# Patient Record
Sex: Female | Born: 1980 | Race: White | Hispanic: No | Marital: Married | Smoking: Never smoker
Health system: Southern US, Community
[De-identification: ages and names within clinical notes are randomized; demographics above are authoritative.]

## PROBLEM LIST (undated history)

## (undated) DIAGNOSIS — Z302 Encounter for sterilization: Secondary | ICD-10-CM

## (undated) DIAGNOSIS — F53 Postpartum depression: Secondary | ICD-10-CM

## (undated) DIAGNOSIS — Z3493 Encounter for supervision of normal pregnancy, unspecified, third trimester: Secondary | ICD-10-CM

## (undated) DIAGNOSIS — J45909 Unspecified asthma, uncomplicated: Secondary | ICD-10-CM

## (undated) DIAGNOSIS — O99345 Other mental disorders complicating the puerperium: Secondary | ICD-10-CM

## (undated) DIAGNOSIS — F419 Anxiety disorder, unspecified: Secondary | ICD-10-CM

## (undated) DIAGNOSIS — F329 Major depressive disorder, single episode, unspecified: Secondary | ICD-10-CM

## (undated) DIAGNOSIS — F32A Depression, unspecified: Secondary | ICD-10-CM

## (undated) HISTORY — DX: Postpartum depression: F53.0

## (undated) HISTORY — DX: Encounter for supervision of normal pregnancy, unspecified, third trimester: Z34.93

## (undated) HISTORY — DX: Depression, unspecified: F32.A

## (undated) HISTORY — DX: Other mental disorders complicating the puerperium: O99.345

## (undated) HISTORY — PX: TONSILLECTOMY: SUR1361

## (undated) HISTORY — DX: Anxiety disorder, unspecified: F41.9

## (undated) HISTORY — DX: Major depressive disorder, single episode, unspecified: F32.9

---
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
## 2013-05-01 ENCOUNTER — Ambulatory Visit (INDEPENDENT_AMBULATORY_CARE_PROVIDER_SITE_OTHER): Payer: 59

## 2013-05-01 ENCOUNTER — Encounter: Payer: Self-pay | Admitting: Obstetrics and Gynecology

## 2013-05-01 DIAGNOSIS — N926 Irregular menstruation, unspecified: Secondary | ICD-10-CM

## 2013-05-01 DIAGNOSIS — Z3201 Encounter for pregnancy test, result positive: Secondary | ICD-10-CM

## 2013-05-01 LAB — POCT PREGNANCY, URINE: Preg Test, Ur: POSITIVE — AB

## 2013-05-01 NOTE — Progress Notes (Signed)
Pt. Came in today for a pregnancy test as she has not had a period since March 20, 2013. Pt. States her periods are regular. Pt. Wants to be seen here. Will obtain OB panel and urine culture today and pt. Will schedule a NOB appointment when she checks out.

## 2013-05-02 LAB — OBSTETRIC PANEL
Basophils Absolute: 0 10*3/uL (ref 0.0–0.1)
Basophils Relative: 0 % (ref 0–1)
Eosinophils Absolute: 0.7 10*3/uL (ref 0.0–0.7)
Hemoglobin: 12.7 g/dL (ref 12.0–15.0)
Hepatitis B Surface Ag: NEGATIVE
MCH: 27.7 pg (ref 26.0–34.0)
MCHC: 34.2 g/dL (ref 30.0–36.0)
MCV: 80.8 fL (ref 78.0–100.0)
Monocytes Absolute: 1 10*3/uL (ref 0.1–1.0)
Monocytes Relative: 7 % (ref 3–12)
Neutro Abs: 10.3 10*3/uL — ABNORMAL HIGH (ref 1.7–7.7)
Neutrophils Relative %: 67 % (ref 43–77)
Platelets: 331 10*3/uL (ref 150–400)
RBC: 4.59 MIL/uL (ref 3.87–5.11)
RDW: 14.5 % (ref 11.5–15.5)
Rubella: 3.51 Index — ABNORMAL HIGH (ref <0.90)

## 2013-05-03 LAB — CULTURE, OB URINE: Colony Count: 3000

## 2013-05-06 ENCOUNTER — Ambulatory Visit: Payer: Self-pay

## 2013-05-20 ENCOUNTER — Encounter: Payer: 59 | Admitting: Advanced Practice Midwife

## 2013-05-23 NOTE — L&D Delivery Note (Signed)
Delivery Note At 1:24 PM a viable female was delivered via Vaginal, Spontaneous Delivery (Presentation: Left Occiput Anterior).  APGAR: 8, 9; weight pending.   Placenta status: , Spontaneous Manual removal.  Cord: 3 vessels with the following complications: None.  Anesthesia: Epidural  Episiotomy: None Lacerations: Labial-bilateral Suture Repair: 3.0 vicryl rapide on right side only Est. Blood Loss (mL): 600  Mom to postpartum.  Baby to Couplet care / Skin to Skin.  Will do circumcision tomorrow am.  Ziona Wickens D 12/27/2013, 1:51 PM

## 2013-05-31 LAB — OB RESULTS CONSOLE RPR: RPR: NONREACTIVE

## 2013-05-31 LAB — OB RESULTS CONSOLE HIV ANTIBODY (ROUTINE TESTING): HIV: NONREACTIVE

## 2013-05-31 LAB — OB RESULTS CONSOLE GC/CHLAMYDIA
Chlamydia: NEGATIVE
Gonorrhea: NEGATIVE

## 2013-10-21 ENCOUNTER — Inpatient Hospital Stay (HOSPITAL_COMMUNITY): Admission: AD | Admit: 2013-10-21 | Payer: 59 | Source: Ambulatory Visit | Admitting: Obstetrics and Gynecology

## 2013-11-29 LAB — OB RESULTS CONSOLE GBS: GBS: POSITIVE

## 2013-12-25 ENCOUNTER — Encounter: Payer: Self-pay | Admitting: Obstetrics and Gynecology

## 2013-12-25 ENCOUNTER — Inpatient Hospital Stay (HOSPITAL_COMMUNITY)
Admission: AD | Admit: 2013-12-25 | Discharge: 2013-12-29 | DRG: 774 | Disposition: A | Discharge status: Home / Self Care | Payer: 59 | Source: Ambulatory Visit | Attending: Obstetrics and Gynecology | Admitting: Obstetrics and Gynecology

## 2013-12-25 ENCOUNTER — Other Ambulatory Visit: Payer: Self-pay | Admitting: Obstetrics and Gynecology

## 2013-12-25 ENCOUNTER — Encounter (HOSPITAL_COMMUNITY): Payer: Self-pay | Admitting: *Deleted

## 2013-12-25 DIAGNOSIS — O9989 Other specified diseases and conditions complicating pregnancy, childbirth and the puerperium: Secondary | ICD-10-CM

## 2013-12-25 DIAGNOSIS — Z3490 Encounter for supervision of normal pregnancy, unspecified, unspecified trimester: Secondary | ICD-10-CM

## 2013-12-25 DIAGNOSIS — O99892 Other specified diseases and conditions complicating childbirth: Secondary | ICD-10-CM | POA: Diagnosis present

## 2013-12-25 DIAGNOSIS — Z22330 Carrier of Group B streptococcus: Secondary | ICD-10-CM

## 2013-12-25 DIAGNOSIS — O09519 Supervision of elderly primigravida, unspecified trimester: Secondary | ICD-10-CM | POA: Diagnosis present

## 2013-12-25 DIAGNOSIS — J45909 Unspecified asthma, uncomplicated: Secondary | ICD-10-CM | POA: Diagnosis present

## 2013-12-25 DIAGNOSIS — Z3493 Encounter for supervision of normal pregnancy, unspecified, third trimester: Secondary | ICD-10-CM

## 2013-12-25 HISTORY — DX: Unspecified asthma, uncomplicated: J45.909

## 2013-12-25 HISTORY — DX: Encounter for supervision of normal pregnancy, unspecified, third trimester: Z34.93

## 2013-12-25 LAB — CBC
HCT: 36.7 % (ref 36.0–46.0)
Hemoglobin: 12.1 g/dL (ref 12.0–15.0)
MCH: 26.7 pg (ref 26.0–34.0)
MCHC: 33 g/dL (ref 30.0–36.0)
MCV: 80.8 fL (ref 78.0–100.0)
PLATELETS: 269 10*3/uL (ref 150–400)
RBC: 4.54 MIL/uL (ref 3.87–5.11)
RDW: 14.7 % (ref 11.5–15.5)
WBC: 15.4 10*3/uL — AB (ref 4.0–10.5)

## 2013-12-25 MED ORDER — ACETAMINOPHEN 325 MG PO TABS: 650.0000 mg | ORAL_TABLET | ORAL | Status: DC | PRN

## 2013-12-25 MED ORDER — MISOPROSTOL 200 MCG PO TABS
50.0000 ug | ORAL_TABLET | ORAL | Status: DC | PRN
  Administered 2013-12-25 – 2013-12-26 (×3): 50 ug via ORAL
  Filled 2013-12-25: qty 1
  Filled 2013-12-25 (×2): qty 0.5

## 2013-12-25 MED ORDER — OXYTOCIN 40 UNITS IN LACTATED RINGERS INFUSION - SIMPLE MED
1.0000 m[IU]/min | INTRAVENOUS | Status: DC
  Administered 2013-12-26: 2 m[IU]/min via INTRAVENOUS
  Filled 2013-12-25: qty 1000

## 2013-12-25 MED ORDER — LACTATED RINGERS IV SOLN
INTRAVENOUS | Status: DC
  Administered 2013-12-25 – 2013-12-26 (×2): via INTRAVENOUS
  Administered 2013-12-26: 1000 mL via INTRAVENOUS
  Administered 2013-12-27: 07:00:00 via INTRAVENOUS

## 2013-12-25 MED ORDER — OXYTOCIN BOLUS FROM INFUSION: 500.0000 mL | INTRAVENOUS | Status: DC

## 2013-12-25 MED ORDER — BUTORPHANOL TARTRATE 1 MG/ML IJ SOLN
1.0000 mg | INTRAMUSCULAR | Status: DC | PRN
  Administered 2013-12-26 (×2): 1 mg via INTRAVENOUS
  Filled 2013-12-25 (×2): qty 1

## 2013-12-25 MED ORDER — TERBUTALINE SULFATE 1 MG/ML IJ SOLN: 0.2500 mg | Freq: Once | INTRAMUSCULAR | Status: AC | PRN

## 2013-12-25 MED ORDER — IBUPROFEN 600 MG PO TABS
600.0000 mg | ORAL_TABLET | Freq: Four times a day (QID) | ORAL | Status: DC | PRN
  Filled 2013-12-25: qty 1

## 2013-12-25 MED ORDER — LACTATED RINGERS IV SOLN
500.0000 mL | INTRAVENOUS | Status: DC | PRN
  Administered 2013-12-26: 1000 mL via INTRAVENOUS

## 2013-12-25 MED ORDER — CITRIC ACID-SODIUM CITRATE 334-500 MG/5ML PO SOLN: 30.0000 mL | ORAL | Status: DC | PRN

## 2013-12-25 MED ORDER — LIDOCAINE HCL (PF) 1 % IJ SOLN
30.0000 mL | INTRAMUSCULAR | Status: AC | PRN
  Administered 2013-12-27: 30 mL via SUBCUTANEOUS
  Filled 2013-12-25: qty 30

## 2013-12-25 MED ORDER — OXYCODONE-ACETAMINOPHEN 5-325 MG PO TABS: 1.0000 | ORAL_TABLET | ORAL | Status: DC | PRN

## 2013-12-25 MED ORDER — OXYTOCIN 40 UNITS IN LACTATED RINGERS INFUSION - SIMPLE MED
62.5000 mL/h | INTRAVENOUS | Status: DC
  Filled 2013-12-25: qty 1000

## 2013-12-25 MED ORDER — ONDANSETRON HCL 4 MG/2ML IJ SOLN
4.0000 mg | Freq: Four times a day (QID) | INTRAMUSCULAR | Status: DC | PRN
  Administered 2013-12-26 – 2013-12-27 (×3): 4 mg via INTRAVENOUS
  Filled 2013-12-25 (×3): qty 2

## 2013-12-25 NOTE — Progress Notes (Signed)
Patient ID: Olivia Rogers, female   DOB: 1980/10/09, 33 y.o.   MRN: [id number]  vtx by Bedside US.

## 2013-12-25 NOTE — H&P (Signed)
Serita Silver is a 33 y.o. female G1P0 at 46+ for IOL, d/w pt and FOB r/b/a of IOL.  Relatively uncomplicated PNC.  Low risk Panorama  Maternal Medical History:  Contractions: Frequency: irregular.   Perceived severity is mild.    Fetal activity: Perceived fetal activity is normal.    Prenatal Complications - Diabetes: none.    OB History   Grav Para Term Preterm Abortions TAB SAB Ect Mult Living   1             G1 present No abn pap, no STD  Past Medical History  Diagnosis Date  . Normal pregnancy in third trimester 12/25/2013  . Asthma   ADD  Past Surgical History  Procedure Laterality Date  . Tonsillectomy     Family History: family history is not on file. Social History:  reports that she has never smoked. She does not have any smokeless tobacco history on file. She reports that she does not drink alcohol or use illicit drugs.RN   Prenatal Transfer Tool  Maternal Diabetes: No Genetic Screening: Normal Maternal Ultrasounds/Referrals: Normal Fetal Ultrasounds or other Referrals:  None Maternal Substance Abuse:  No Significant Maternal Medications:  None Significant Maternal Lab Results:  Lab values include: Group B Strep positive Other Comments:  low risk Panorama  Review of Systems  Constitutional: Negative.   HENT: Negative.   Eyes: Negative.   Respiratory: Negative.   Cardiovascular: Negative.   Gastrointestinal: Negative.   Genitourinary: Negative.   Musculoskeletal: Negative.   Skin: Negative.   Neurological: Negative.   Psychiatric/Behavioral: Negative.     Dilation: Fingertip Effacement (%): Thick Station: -2 Exam by:: Dr. Ellyn Hack in office today Blood pressure 117/79, pulse 109, temperature 98.9 F (37.2 C), temperature source Oral, resp. rate 18, height 5\' 6"  (1.676 m), weight 107.049 kg (236 lb). Maternal Exam:  Abdomen: Fundal height is appropriate for gestation.   Estimated fetal weight is 8#.   Fetal presentation: vertex  Introitus:  Normal vulva. Normal vagina.  Pelvis: adequate for delivery.   Cervix: Cervix evaluated by digital exam.     Physical Exam  Constitutional: She is oriented to person, place, and time. She appears well-developed and well-nourished.  HENT:  Head: Normocephalic and atraumatic.  Cardiovascular: Normal rate and regular rhythm.   Respiratory: Effort normal and breath sounds normal. No respiratory distress. She has no wheezes.  GI: Soft. Bowel sounds are normal. She exhibits no distension. There is no tenderness.  Musculoskeletal: Normal range of motion.  Neurological: She is alert and oriented to person, place, and time.  Skin: Skin is warm and dry.  Psychiatric: She has a normal mood and affect. Her behavior is normal.    Prenatal labs: ABO, Rh: A/POS/-- (12/10 1201) Antibody: NEG (12/10 1201) Rubella: 3.51 (12/10 1201) RPR: Nonreactive (01/09 0000)  HBsAg: NEGATIVE (12/10 1201)  HIV: Non-reactive (01/09 0000)  GBS: Positive (07/10 0000)    Vtx by [place]   ToxoIgG neg, Panorama low risk, AFP WNL. CF neg/ CF neg/ AFP WNL/ Ur Cx +/ GC neg/ Chl neg/glucola 123  Assessment/Plan: 33yo G1P0 at 40+ for IOL Gbbs+ - start PCN in AM Expect SVD    Bovard-Stuckert, Miyako Oelke 12/25/2013, 8:33 PM

## 2013-12-26 ENCOUNTER — Encounter (HOSPITAL_COMMUNITY): Payer: Self-pay | Admitting: Anesthesiology

## 2013-12-26 ENCOUNTER — Inpatient Hospital Stay (HOSPITAL_COMMUNITY): Payer: 59 | Admitting: Anesthesiology

## 2013-12-26 ENCOUNTER — Encounter (HOSPITAL_COMMUNITY): Payer: 59 | Admitting: Anesthesiology

## 2013-12-26 LAB — TYPE AND SCREEN
ABO/RH(D): A POS
ANTIBODY SCREEN: NEGATIVE

## 2013-12-26 LAB — RPR

## 2013-12-26 MED ORDER — PHENYLEPHRINE 40 MCG/ML (10ML) SYRINGE FOR IV PUSH (FOR BLOOD PRESSURE SUPPORT)
80.0000 ug | PREFILLED_SYRINGE | INTRAVENOUS | Status: DC | PRN
  Filled 2013-12-26: qty 2
  Filled 2013-12-26: qty 10

## 2013-12-26 MED ORDER — DIPHENHYDRAMINE HCL 50 MG/ML IJ SOLN: 12.5000 mg | INTRAMUSCULAR | Status: DC | PRN

## 2013-12-26 MED ORDER — PENICILLIN G POTASSIUM 5000000 UNITS IJ SOLR
5.0000 10*6.[IU] | Freq: Once | INTRAVENOUS | Status: AC
  Administered 2013-12-26: 5 10*6.[IU] via INTRAVENOUS
  Filled 2013-12-26: qty 5

## 2013-12-26 MED ORDER — PHENYLEPHRINE 40 MCG/ML (10ML) SYRINGE FOR IV PUSH (FOR BLOOD PRESSURE SUPPORT)
PREFILLED_SYRINGE | INTRAVENOUS | Status: AC
  Filled 2013-12-26: qty 10

## 2013-12-26 MED ORDER — FENTANYL 2.5 MCG/ML BUPIVACAINE 1/10 % EPIDURAL INFUSION (WH - ANES)
INTRAMUSCULAR | Status: AC
  Filled 2013-12-26: qty 125

## 2013-12-26 MED ORDER — EPHEDRINE 5 MG/ML INJ
10.0000 mg | INTRAVENOUS | Status: DC | PRN
  Filled 2013-12-26: qty 2

## 2013-12-26 MED ORDER — PENICILLIN G POTASSIUM 5000000 UNITS IJ SOLR
2.5000 10*6.[IU] | INTRAVENOUS | Status: DC
  Administered 2013-12-26 – 2013-12-27 (×7): 2.5 10*6.[IU] via INTRAVENOUS
  Filled 2013-12-26 (×12): qty 2.5

## 2013-12-26 MED ORDER — LIDOCAINE HCL (PF) 1 % IJ SOLN
INTRAMUSCULAR | Status: DC | PRN
  Administered 2013-12-26 (×2): 9 mL

## 2013-12-26 MED ORDER — PHENYLEPHRINE 40 MCG/ML (10ML) SYRINGE FOR IV PUSH (FOR BLOOD PRESSURE SUPPORT)
80.0000 ug | PREFILLED_SYRINGE | INTRAVENOUS | Status: DC | PRN
  Filled 2013-12-26: qty 2

## 2013-12-26 MED ORDER — LACTATED RINGERS IV SOLN: 500.0000 mL | Freq: Once | INTRAVENOUS | Status: DC

## 2013-12-26 MED ORDER — FENTANYL 2.5 MCG/ML BUPIVACAINE 1/10 % EPIDURAL INFUSION (WH - ANES)
INTRAMUSCULAR | Status: DC | PRN
  Administered 2013-12-26: 14 mL/h via EPIDURAL

## 2013-12-26 NOTE — Anesthesia Preprocedure Evaluation (Signed)
Anesthesia Evaluation  Patient identified by MRN, date of birth, ID band Patient awake    Reviewed: Allergy & Precautions, H&P , NPO status , Patient's Chart, lab work & pertinent test results  Airway Mallampati: II TM Distance: >3 FB Neck ROM: full    Dental no notable dental hx.    Pulmonary neg pulmonary ROS,    Pulmonary exam normal       Cardiovascular negative cardio ROS      Neuro/Psych negative neurological ROS  negative psych ROS   GI/Hepatic negative GI ROS, Neg liver ROS,   Endo/Other  negative endocrine ROS  Renal/GU negative Renal ROS     Musculoskeletal   Abdominal (+) + obese,   Peds  Hematology negative hematology ROS (+)   Anesthesia Other Findings   Reproductive/Obstetrics (+) Pregnancy                           Anesthesia Physical Anesthesia Plan  ASA: II  Anesthesia Plan: Epidural   Post-op Pain Management:    Induction:   Airway Management Planned:   Additional Equipment:   Intra-op Plan:   Post-operative Plan:   Informed Consent: I have reviewed the patients History and Physical, chart, labs and discussed the procedure including the risks, benefits and alternatives for the proposed anesthesia with the patient or authorized representative who has indicated his/her understanding and acceptance.     Plan Discussed with:   Anesthesia Plan Comments:         Anesthesia Quick Evaluation

## 2013-12-26 NOTE — Progress Notes (Signed)
Patient ID: Olivia Rogers, female   DOB: 08/05/1980, 33 y.o.   MRN: [id number]  Comfortable with epidural  AFVSS gen NAD FHTs120's, category 1 toco Q 2-5 min  SVE 2.3/50/-1  Cont IOL

## 2013-12-26 NOTE — Progress Notes (Signed)
Patient ID: Olivia Rogers, female   DOB: July 07, 1980, 33 y.o.   MRN: [id number]  No c/o's.  +FM, some ctx  AFVSS gen NAD  FHTs 130's, good variability, category 1 toco q 2-61min  SVE 1.5/30/-2  AROM for clear fluid with FSE, no complications  Expect SVD Pitocin prn Epidural prn

## 2013-12-26 NOTE — Progress Notes (Signed)
Patient ID: Olivia Rogers, female   DOB: 10/24/1980, 33 y.o.   MRN: [id number]  Starting to feel ctx with pitocin, +FM  AFVSS  FHTs 150's mod var, category 1 toco Q 2- 5min  SVE unchanged  On pitocin Continue current mgmt

## 2013-12-26 NOTE — Progress Notes (Signed)
Patient ID: Olivia Rogers, female   DOB: [birth year], 33 y.o.   MRN: [id number]  Comfortable with epidural - som epressure  AFVSS gen NAD FHTs 120's mod var,  toco Q 3-31min  SVE 5/90/0  33yo G1P0, continue IOL

## 2013-12-26 NOTE — Anesthesia Procedure Notes (Addendum)
Epidural Patient location during procedure: OB Start time: 12/26/2013 5:23 PM End time: 12/26/2013 5:27 PM  Staffing Anesthesiologist: Leilani Able Performed by: anesthesiologist   Preanesthetic Checklist Completed: patient identified, surgical consent, pre-op evaluation, timeout performed, IV checked, risks and benefits discussed and monitors and equipment checked  Epidural Patient position: sitting Prep: site prepped and draped and DuraPrep Patient monitoring: continuous pulse ox and blood pressure Approach: midline Location: L3-L4 Injection technique: LOR air  Needle:  Needle type: Tuohy  Needle gauge: 17 G Needle length: 9 cm and 9 Needle insertion depth: 7 cm Catheter type: closed end flexible Catheter size: 19 Gauge Catheter at skin depth: 12 cm Test dose: negative and Other  Assessment Sensory level: T9 Events: blood not aspirated, injection not painful, no injection resistance, negative IV test and no paresthesia  Additional Notes Reason for block:procedure for pain  Epidural

## 2013-12-27 ENCOUNTER — Encounter (HOSPITAL_COMMUNITY): Payer: Self-pay | Admitting: *Deleted

## 2013-12-27 LAB — CBC
HCT: 32.8 % — ABNORMAL LOW (ref 36.0–46.0)
Hemoglobin: 10.8 g/dL — ABNORMAL LOW (ref 12.0–15.0)
MCH: 26.7 pg (ref 26.0–34.0)
MCHC: 32.9 g/dL (ref 30.0–36.0)
MCV: 81 fL (ref 78.0–100.0)
Platelets: 165 10*3/uL (ref 150–400)
RBC: 4.05 MIL/uL (ref 3.87–5.11)
RDW: 14.5 % (ref 11.5–15.5)
WBC: 25.6 10*3/uL — ABNORMAL HIGH (ref 4.0–10.5)

## 2013-12-27 LAB — ABO/RH: ABO/RH(D): A POS

## 2013-12-27 MED ORDER — ONDANSETRON HCL 4 MG/2ML IJ SOLN: 4.0000 mg | INTRAMUSCULAR | Status: DC | PRN

## 2013-12-27 MED ORDER — LANOLIN HYDROUS EX OINT: TOPICAL_OINTMENT | CUTANEOUS | Status: DC | PRN

## 2013-12-27 MED ORDER — TETANUS-DIPHTH-ACELL PERTUSSIS 5-2.5-18.5 LF-MCG/0.5 IM SUSP: 0.5000 mL | Freq: Once | INTRAMUSCULAR | Status: DC

## 2013-12-27 MED ORDER — MAGNESIUM HYDROXIDE 400 MG/5ML PO SUSP: 30.0000 mL | ORAL | Status: DC | PRN

## 2013-12-27 MED ORDER — WITCH HAZEL-GLYCERIN EX PADS: 1.0000 "application " | MEDICATED_PAD | CUTANEOUS | Status: DC | PRN

## 2013-12-27 MED ORDER — PRENATAL MULTIVITAMIN CH
1.0000 | ORAL_TABLET | Freq: Every day | ORAL | Status: DC
  Administered 2013-12-28 – 2013-12-29 (×2): 1 via ORAL
  Filled 2013-12-27 (×2): qty 1

## 2013-12-27 MED ORDER — FENTANYL 2.5 MCG/ML BUPIVACAINE 1/10 % EPIDURAL INFUSION (WH - ANES)
14.0000 mL/h | INTRAMUSCULAR | Status: DC | PRN
  Administered 2013-12-26: 17:00:00 via EPIDURAL
  Administered 2013-12-27 (×2): 14 mL/h via EPIDURAL
  Filled 2013-12-27 (×2): qty 125

## 2013-12-27 MED ORDER — ALBUTEROL SULFATE (2.5 MG/3ML) 0.083% IN NEBU: 2.5000 mg | INHALATION_SOLUTION | Freq: Four times a day (QID) | RESPIRATORY_TRACT | Status: DC | PRN

## 2013-12-27 MED ORDER — OXYCODONE-ACETAMINOPHEN 5-325 MG PO TABS
1.0000 | ORAL_TABLET | ORAL | Status: DC | PRN
  Administered 2013-12-27 – 2013-12-28 (×2): 1 via ORAL
  Filled 2013-12-27 (×2): qty 1

## 2013-12-27 MED ORDER — OXYTOCIN 40 UNITS IN LACTATED RINGERS INFUSION - SIMPLE MED
250.0000 mL/h | INTRAVENOUS | Status: DC
Start: 2013-12-27 — End: 2013-12-29
  Administered 2013-12-27: 250 mL/h via INTRAVENOUS

## 2013-12-27 MED ORDER — BENZOCAINE-MENTHOL 20-0.5 % EX AERO
1.0000 "application " | INHALATION_SPRAY | CUTANEOUS | Status: DC | PRN
  Filled 2013-12-27: qty 56

## 2013-12-27 MED ORDER — METHYLERGONOVINE MALEATE 0.2 MG PO TABS: 0.2000 mg | ORAL_TABLET | ORAL | Status: DC | PRN

## 2013-12-27 MED ORDER — ONDANSETRON HCL 4 MG PO TABS: 4.0000 mg | ORAL_TABLET | ORAL | Status: DC | PRN

## 2013-12-27 MED ORDER — METHYLERGONOVINE MALEATE 0.2 MG/ML IJ SOLN
0.2000 mg | Freq: Once | INTRAMUSCULAR | Status: AC
  Administered 2013-12-27: 0.2 mg via INTRAMUSCULAR

## 2013-12-27 MED ORDER — LORATADINE 10 MG PO TABS
10.0000 mg | ORAL_TABLET | Freq: Every day | ORAL | Status: DC
  Administered 2013-12-28 – 2013-12-29 (×2): 10 mg via ORAL
  Filled 2013-12-27 (×4): qty 1

## 2013-12-27 MED ORDER — SENNOSIDES-DOCUSATE SODIUM 8.6-50 MG PO TABS
2.0000 | ORAL_TABLET | ORAL | Status: DC
  Administered 2013-12-27 – 2013-12-28 (×2): 2 via ORAL
  Filled 2013-12-27 (×2): qty 2

## 2013-12-27 MED ORDER — ZOLPIDEM TARTRATE 5 MG PO TABS: 5.0000 mg | ORAL_TABLET | Freq: Every evening | ORAL | Status: DC | PRN

## 2013-12-27 MED ORDER — MEASLES, MUMPS & RUBELLA VAC ~~LOC~~ INJ: 0.5000 mL | INJECTION | Freq: Once | SUBCUTANEOUS | Status: DC

## 2013-12-27 MED ORDER — SIMETHICONE 80 MG PO CHEW: 80.0000 mg | CHEWABLE_TABLET | ORAL | Status: DC | PRN

## 2013-12-27 MED ORDER — DIPHENHYDRAMINE HCL 25 MG PO CAPS: 25.0000 mg | ORAL_CAPSULE | Freq: Four times a day (QID) | ORAL | Status: DC | PRN

## 2013-12-27 MED ORDER — METHYLERGONOVINE MALEATE 0.2 MG/ML IJ SOLN: 0.2000 mg | INTRAMUSCULAR | Status: DC | PRN

## 2013-12-27 MED ORDER — METHYLERGONOVINE MALEATE 0.2 MG/ML IJ SOLN
INTRAMUSCULAR | Status: AC
  Filled 2013-12-27: qty 1

## 2013-12-27 MED ORDER — DIBUCAINE 1 % RE OINT: 1.0000 "application " | TOPICAL_OINTMENT | RECTAL | Status: DC | PRN

## 2013-12-27 MED ORDER — IBUPROFEN 600 MG PO TABS
600.0000 mg | ORAL_TABLET | Freq: Four times a day (QID) | ORAL | Status: DC
  Administered 2013-12-27 – 2013-12-29 (×7): 600 mg via ORAL
  Filled 2013-12-27 (×7): qty 1

## 2013-12-27 NOTE — Progress Notes (Signed)
Patient ID: Olivia Rogers, female   DOB: 11/04/1980, 33 y.o.   MRN: [id number]  No c/o's except pelvic pressure  AFVSS gen NAD FHTs 120's, category 1 toco Q 3min, adequate ctx  SVD 8.9/90/+1  33yo G1P0 at 40+ IOL given post dates Expect SVD Slow, steady change overnight Will continue current management

## 2013-12-27 NOTE — Progress Notes (Signed)
CTSP for bleeding Pt not feeling well, looks pale Afeb, VSS Fundus firm About 300 cc clot and a significant amount of urine expressed on exam, some small clots in LUS removed Stage I PPH, initiating code hemorrhage, will give IV pitocin and IM Methergine

## 2013-12-27 NOTE — Progress Notes (Signed)
Rope pull

## 2013-12-27 NOTE — Progress Notes (Signed)
Dr Arby Barrettehatchett in room admin sitting dose

## 2013-12-27 NOTE — Progress Notes (Signed)
Still with some pain on right Afeb, VSS FHT- Cat I VE-Reducible anterior lip to complete/C/+1, vtx Will start pushing, anticipate SVD

## 2013-12-28 LAB — CBC
HEMATOCRIT: 25.5 % — AB (ref 36.0–46.0)
HEMOGLOBIN: 8.5 g/dL — AB (ref 12.0–15.0)
MCH: 26.8 pg (ref 26.0–34.0)
MCHC: 33.3 g/dL (ref 30.0–36.0)
MCV: 80.4 fL (ref 78.0–100.0)
Platelets: 165 10*3/uL (ref 150–400)
RBC: 3.17 MIL/uL — AB (ref 3.87–5.11)
RDW: 14.6 % (ref 11.5–15.5)
WBC: 22.6 10*3/uL — ABNORMAL HIGH (ref 4.0–10.5)

## 2013-12-28 NOTE — Anesthesia Postprocedure Evaluation (Signed)
Anesthesia Post Note  Patient: Olivia Rogers  Procedure(s) Performed: * No procedures listed *  Anesthesia type: Epidural  Patient location: Mother/Baby  Post pain: Pain level controlled  Post assessment: Post-op Vital signs reviewed  Last Vitals:  Filed Vitals:   12/28/13 1217  BP: 98/56  Pulse: 81  Temp: 36.9 C  Resp: 20    Post vital signs: Reviewed  Level of consciousness:alert  Complications: No apparent anesthesia complications

## 2013-12-28 NOTE — Lactation Note (Signed)
This note was copied from the chart of Olivia Rogers. Lactation Consultation Note New mom w/no BF experience. She did go to the BF classes. Has Lg. Pendulum shaped breast, soft w/everted nipples. Hand expression taught and noted colostrum. Mom was excited. Encouraged mom to BF STS. Discussed different positions. Assisted in football and obtained deep latch.Demonstrated good flange and nutritive suckling verses non-nutritive suckling. Mom encouraged to feed baby 8-12 times/24 hours and with feeding cues. Reviewed Baby & Me book's Breastfeeding Basics. WH/LC brochure given w/resources, support groups and LC services. Educated about newborn behavior. Encouraged to call for assistance if needed and to verify proper latch. Encouraged comfort during BF so colostrum flows better and mom will enjoy the feeding longer. Taking deep breaths and breast massage during BF. Referred to Baby and Me Book in Breastfeeding section Pg. 22-23 for position options and Proper latch demonstration. Mom encouraged to waken baby for feeds.   Patient Name: Olivia Ren Nehls [id number] Date: 12/28/2013 Reason for consult: Initial assessment   Maternal Data Has patient been taught Hand Expression?: Yes Does the patient have breastfeeding experience prior to this delivery?: No  Feeding Feeding Type: Breast Fed Length of feed: 15 min  LATCH Score/Interventions Latch: Grasps breast easily, tongue down, lips flanged, rhythmical sucking. Intervention(s): Adjust position;Assist with latch;Breast massage;Breast compression  Audible Swallowing: Spontaneous and intermittent Intervention(s): Skin to skin;Hand expression Intervention(s): Hand expression;Skin to skin;Alternate breast massage  Type of Nipple: Everted at rest and after stimulation  Comfort (Breast/Nipple): Soft / non-tender     Hold (Positioning): Assistance needed to correctly position infant at breast and maintain latch. Intervention(s): Breastfeeding basics  reviewed;Support Pillows;Position options;Skin to skin  LATCH Score: 9  Lactation Tools Discussed/Used     Consult Status Consult Status: Follow-up Date: 12/29/13 Follow-up type: In-patient    Charyl Dancer 12/28/2013, 2:52 PM

## 2013-12-28 NOTE — Progress Notes (Signed)
PPD #1 No problems Afeb, VSS Fundus firm, NT at U-1 Continue routine postpartum care 

## 2013-12-29 MED ORDER — IBUPROFEN 600 MG PO TABS: 600.0000 mg | ORAL_TABLET | Freq: Four times a day (QID) | ORAL | Status: DC

## 2013-12-29 NOTE — Progress Notes (Signed)
PPD #2 No problems Afeb, VSS D/c home 

## 2013-12-29 NOTE — Discharge Summary (Signed)
Obstetric Discharge Summary Reason for Admission: induction of labor Prenatal Procedures: none Intrapartum Procedures: spontaneous vaginal delivery Postpartum Procedures: none Complications-Operative and Postpartum: bilateral labial laceration Hemoglobin  Date Value Ref Range Status  12/28/2013 8.5* 12.0 - 15.0 g/dL Final     DELTA CHECK NOTED     REPEATED TO VERIFY     HCT  Date Value Ref Range Status  12/28/2013 25.5* 36.0 - 46.0 % Final    Physical Exam:  General: alert Lochia: appropriate Uterine Fundus: firm   Discharge Diagnoses: Term Pregnancy-delivered  Discharge Information: Date: 12/29/2013 Activity: pelvic rest Diet: routine Medications: Ibuprofen Condition: stable Instructions: refer to practice specific booklet Discharge to: home Follow-up Information   Follow up with Kawanna Christley D, MD. Schedule an appointment as soon as possible for a visit in 6 weeks.   Specialty:  Obstetrics and Gynecology   Contact information:   [address], [address] [place] [address] [phone]       Newborn Data: Live born female  Birth Weight: 7 lb 12 oz (3515 g) APGAR: 8, 9  Home with mother.  Olivia Rogers 12/29/2013, 9:48 AM

## 2013-12-29 NOTE — Discharge Instructions (Signed)
As per discharge pamphlet °

## 2014-01-04 ENCOUNTER — Encounter (HOSPITAL_COMMUNITY): Payer: Self-pay

## 2014-01-04 ENCOUNTER — Inpatient Hospital Stay (HOSPITAL_COMMUNITY): Payer: 59

## 2014-01-04 ENCOUNTER — Inpatient Hospital Stay (HOSPITAL_COMMUNITY)
Admission: AD | Admit: 2014-01-04 | Discharge: 2014-01-04 | Disposition: A | Discharge status: Home / Self Care | Payer: 59 | Source: Ambulatory Visit | Attending: Obstetrics and Gynecology | Admitting: Obstetrics and Gynecology

## 2014-01-04 LAB — CBC
HEMATOCRIT: 28.1 % — AB (ref 36.0–46.0)
Hemoglobin: 9 g/dL — ABNORMAL LOW (ref 12.0–15.0)
MCH: 26.4 pg (ref 26.0–34.0)
MCHC: 32 g/dL (ref 30.0–36.0)
MCV: 82.4 fL (ref 78.0–100.0)
Platelets: 431 10*3/uL — ABNORMAL HIGH (ref 150–400)
RBC: 3.41 MIL/uL — ABNORMAL LOW (ref 3.87–5.11)
RDW: 15.2 % (ref 11.5–15.5)
WBC: 15.7 10*3/uL — ABNORMAL HIGH (ref 4.0–10.5)

## 2014-01-04 MED ORDER — AMOXICILLIN-POT CLAVULANATE 875-125 MG PO TABS: 1.0000 | ORAL_TABLET | Freq: Two times a day (BID) | ORAL | Status: DC

## 2014-01-04 NOTE — MAU Provider Note (Signed)
History     CSN: [id number]  Arrival date and time: 01/04/14 0600   None     Chief Complaint  Patient presents with  . Vaginal Bleeding   Vaginal Bleeding The patient's primary symptoms include pelvic pain and vaginal bleeding. This is a new problem. The current episode started yesterday. She is not pregnant. Pertinent negatives include no chills or fever. The vaginal discharge was bloody. The vaginal bleeding is heavier than menses. She has been passing clots (small raisin sized). She has not been passing tissue. She has tried acetaminophen and NSAIDs for the symptoms. She is not sexually active.   Pt presents from vaginal delivery 8 days ago with c/o increased VB and some opelvic tenderness and cramping.  She had an NSVD with manual extraction of placenta and a mild PPH following delivery that responded to medication quickly.  She was d/c home with a Hgb of 8.4 and asymptomatic.  She had normal lochia up until yesterday and bleeding began to be a bit heavier and crampier.  Clots were not any larger than raisins, and flow was like a heavy period.  No fever, some increased discomfort with cramping and tenderness.  Baby doing well.   OB History   Grav Para Term Preterm Abortions TAB SAB Ect Mult Living   1 1 1       1       Past Medical History  Diagnosis Date  . Normal pregnancy in third trimester 12/25/2013  . Asthma     Past Surgical History  Procedure Laterality Date  . Tonsillectomy      Family History  Problem Relation Age of Onset  . Hypertension Mother     History  Substance Use Topics  . Smoking status: Never Smoker   . Smokeless tobacco: Never Used  . Alcohol Use: No    Allergies:  Allergies  Allergen Reactions  . Sulfa Antibiotics Nausea And Vomiting    Prescriptions prior to admission  Medication Sig Dispense Refill  . albuterol (PROVENTIL HFA;VENTOLIN HFA) 108 (90 BASE) MCG/ACT inhaler Inhale 1-2 puffs into the lungs every 6 (six) hours as needed for  wheezing or shortness of breath.      . cetirizine (ZYRTEC) 10 MG tablet Take 10 mg by mouth daily.      Marland Kitchen. ibuprofen (ADVIL,MOTRIN) 600 MG tablet Take 1 tablet (600 mg total) by mouth every 6 (six) hours.  30 tablet  0  . Prenatal Vit-Fe Fumarate-FA (PRENATAL MULTIVITAMIN) TABS tablet Take 1 tablet by mouth daily.        Review of Systems  Constitutional: Negative for fever and chills.  Genitourinary: Positive for vaginal bleeding and pelvic pain.   Physical Exam   Blood pressure 131/72, pulse 59, temperature 98.3 F (36.8 C), resp. rate 18, height 5\' 6"  (1.676 m), weight 104.055 kg (229 lb 6.4 oz), SpO2 100.00%, unknown if currently breastfeeding.  Physical Exam  Constitutional: She is oriented to person, place, and time. She appears well-developed and well-nourished.  Cardiovascular: Normal rate.   Respiratory: Effort normal.  GI: Soft. There is tenderness.  Genitourinary: Vagina normal.  Uterus normal postpartum size, about 12-15 weeeks Tender over uterus Speculum exam reveals no heavy VB, Mild cervical motion tenderness Cervix closed  Neurological: She is alert and oriented to person, place, and time.    MAU Course  Procedures   Hgb improved at 9.0 US shows debris in endometrial canal, favor clot vs retained POC's Measures 3.4 cm  Assessment and Plan  D/w  pt US findings that suggest retained clot over placenta, but cannot completely r/o without D&C Hgb improved Mild tenderness may be an endometritis so will treat with Augmentin 875mg  po BID for 7 days Bleeding precautions given to patient and d/w her if bleeding becomes heavy, saturates pad/hour or cramping severe she needs to let us know and we would likely proceed with a D&C Pt agrees to plan  Airrion Otting W 01/04/2014, 8:17 AM

## 2014-01-04 NOTE — MAU Note (Signed)
SVD 12/27/13. G1 Had Level 1 PP hemorrhage about an hour after delivery. Yesterday and today has had alittle more bleeding with cramping. Abd is sore the the touch below the waste. Some small clots.

## 2014-01-04 NOTE — MAU Note (Signed)
Rx for Augmentin 825-125mg  sig 1 po bid x14 days disp 28 no refills called in to [facility] [address]

## 2014-01-07 ENCOUNTER — Ambulatory Visit (HOSPITAL_COMMUNITY)
Admission: RE | Admit: 2014-01-07 | Discharge: 2014-01-07 | Disposition: A | Discharge status: Home / Self Care | Payer: 59 | Source: Ambulatory Visit | Attending: Obstetrics and Gynecology | Admitting: Obstetrics and Gynecology

## 2014-01-07 NOTE — Lactation Note (Signed)
Lactation Consult  Mother's reason for visit: Ensure proper latch, maintain milk Visit Type:  Feeding assist Appointment Notes:  NONE Consult:  Initial Lactation Consultant:  Huston Foley  ________________________________________________________________________  [name]  Date of Birth: 12/27/2015  Pediatrician:TUCKER  Gender: female  Gestational Age: [redacted]w[redacted]d (At Birth)  Birth Weight: 7 lb 12 oz (3515 g)  Weight at Discharge: Weight: 7 lb 5.8 oz (3340 g) Date of Discharge: 12/29/2013  Filed Weights   12/27/13 1324 12/27/13 2310 12/28/13 2328  Weight: 7 lb 12 oz (3515 g) 7 lb 11.1 oz (3490 g) 7 lb 5.8 oz (3340 g)  [facility]: 7-2 ON 01/01/14 Location:Pediatrician's office  Weight today: 7-9.6   ________________________________________________________________________  Mother's Name: Olivia Rogers Type of delivery:  VAGINAL Breastfeeding Experience:  FIRST BABY Maternal Medical Conditions  NONE Maternal Medications:  PNV'S  ________________________________________________________________________  Breastfeeding History (Post Discharge)  Frequency of breastfeeding:  EVERY 2-3 HOURS Duration of feeding:  5-40 MINUTES  SUPPLEMENTATION: 30-60 MLS EVERY 3 HOURS FORMULA PER BOTTLE  PUMPING: 1-2 DAYS PER DAY 5-30 MLS Infant Intake and Output Assessment  Voids:  10-13 in 24 hrs.  Color:  Clear yellow Stools:  3-6 in 24 hrs.  Color:  Yellow  ________________________________________________________________________  Maternal Breast Assessment  Breast:  Filling Nipple:  Erect Pain level:  0 Pain interventions:  N/A  _______________________________________________________________________ Feeding Assessment/Evaluation  Mom and 81 day old baby here for feeding assessment.  Mom states baby continued to lose weight after discharge and she was instructed to start supplementation per pedi.  Baby was also followed for jaundice  and bili was elevated to 20 per mom.  Mom teary eyed because she does not want to give formula.  She reports baby is sleepy very at breast.  Observed mom latch baby easily to breast.  Baby initially too shallow so relatched and baby was able to obtain deeper latch.  Initially audible swallows heard but baby becomes non nutritive quickly.  Baby nursed on both breasts for a total of 30 minutes and only transferred 18 mls.  Discussed plan with mom the goals are 1) increase milk supply 2) continue baby's weight gain of at least 1 ounce per day.  Instructed to feed with feeding cues but at least every 3 hours.  Use good breast massage and compression during the feeding. Pump both breasts after feedings x 15-20 minutes.  Supplement baby with 2-2 1/2 ounces of expressed milk and or formula every 3 hours.  Mom given much encouragement.  Outpatient appointment scheduled for 01/15/14.  Encouraged to call with any concerns/questions.  Initial feeding assessment:  Infant's oral assessment:  WNL  Positioning:  Cross cradle Right breast/Left breast  LATCH documentation:  Latch:  2 = Grasps breast easily, tongue down, lips flanged, rhythmical sucking.  Audible swallowing:  1 = A few with stimulation  Type of nipple:  2 = Everted at rest and after stimulation  Comfort (Breast/Nipple):  2 = Soft / non-tender  Hold (Positioning):  2 = No assistance needed to correctly position infant at breast  LATCH score:  9  Attached assessment:  Shallow/corrected and baby obtained deeper latch  Lips flanged:  Yes.    Lips untucked:  No.  Suck assessment:  Displays both  Tools:  Bottle Instructed on use and cleaning of tool:  Yes.    Pre-feed weight:  3448 g   Post-feed [id number]:   g  Amount transferred:  18  ml Amount supplemented:  60 ml      Total amount transferred:  18 ml Total supplement given:   ml

## 2014-01-09 ENCOUNTER — Ambulatory Visit (HOSPITAL_COMMUNITY): Payer: 59

## 2014-01-15 ENCOUNTER — Ambulatory Visit (HOSPITAL_COMMUNITY)
Admission: RE | Admit: 2014-01-15 | Discharge: 2014-01-15 | Disposition: A | Discharge status: Home / Self Care | Payer: 59 | Source: Ambulatory Visit | Attending: Obstetrics and Gynecology | Admitting: Obstetrics and Gynecology

## 2014-01-15 NOTE — Lactation Note (Signed)
Lactation Consult  Mother's reason for visit: - Follow up from last weeks LC O/P visit  Visit Type:  Follow up feeding assessment due to low weight gain , low milk supply  Appointment Notes:  [facility] employee - confirmed  Consult:  Follow-Up Lactation Consultant:  Kathrin Greathouse  ________________________________________________________________________ Olivia Rogers Name: Olivia Rogers  Date of Birth: 12/27/2015  Pediatrician: Dr. Dahlia Byes  Gender: female  Gestational Age: [redacted]w[redacted]d (At Birth)  Birth Weight: 7 lb 12 oz (3515 g)  Weight at Discharge: Weight: 7 lb 5.8 oz (3340 g) Date of Discharge: 12/29/2013  Filed Weights   12/27/13 1324 12/27/13 2310 12/28/13 2328  Weight: 7 lb 12 oz (3515 g) 7 lb 11.1 oz (3490 g) 7 lb 5.8 oz (3340 g)  Last weight taken from location outside of [facility]: 7-10 oz  Location per mom lactation O/P  Weight today: 8-9 oz   ________________________________________________________________________  Mother's Name: Olivia Rogers Type of delivery:   Breastfeeding Experience:  I can now hear him swallowing , latch still remains practice , amount milk varies 5- 30 ml  Maternal Medical Conditions:  Excessive edema , improving  Maternal Medications:  PNV , Mothers milk ( per mom which is helping, Cyrtec ( for allergies )   ________________________________________________________________________  Breastfeeding History (Post Discharge) - per mom challenges with jaundice , causing the baby to be sleepy at the breast  Which has affected my milk supply. Mom denies sore nipples, plugged ducts , or engorgement. Per mom has been following the lactation plan from last week LC apt. The pumping yield has only been 5- 30 ml total off both breast .and I'm supplementing with EBM or formula 60 -90 ml with  a bottle ( Medela nipple ). Also taking Mother Love Plus ( herbs ) and eating the Lactation cookies.   Frequency of breastfeeding:  Every 2-3 hours and on demand with feeding  cues  Duration of feeding:  5 - 45 mins   Supplementing - per mom supplementing with EBM and Formula , range 60 - 90 ml  Pumping with DEBP - Medela - volume range 5 ml - 30 ml with #27 Flange , per mom loss a  lot of water weight in the last week , LC suggested to try the #24 Flange again , and see if the volume increase.  Infant Intake and Output Assessment  Voids:  10-14  in 24 hrs.  Color:  Clear yellow Stools:  2-5  in 24 hrs.  Color:  Brown and Yellow  ________________________________________________________________________  Maternal Breast Assessment  Breast:  Full Nipple:  Erect Pain level:  0 Pain interventions:  Expressed breast milk  _______________________________________________________________________ Feeding Assessment/Evaluation  Initial feeding assessment: Baby alert , and rooting, good tongue mobility.   Infant's oral assessment:  WNL  Positioning:  Football Right breast  LATCH documentation:  Latch:  2 = Grasps breast easily, tongue down, lips flanged, rhythmical sucking.  Audible swallowing:  2 = Spontaneous and intermittent  Type of nipple:  2 = Everted at rest and after stimulation  Comfort (Breast/Nipple):  1 = Filling, red/small blisters or bruises, mild/mod discomfort ( just full , no breakdown or soreness )   Hold (Positioning):  2 = No assistance needed to correctly position infant at breast  LATCH score: 9   Attached assessment:  Deep  Lips flanged:  No.  Lips untucked:  Yes.    Suck assessment:  Nutritive , noted to be non-nutritive after a let down ,  with stimulation gets back into a pattern.   Tools:  None at consult , using a DEBP Medela at home for post pumping both breast and Medela nipple for supplementing with a bottle  Instructed on use and cleaning of tool:  No. Wet and then re-weight  Pre-feed weight:3868 g  (8 lb. 8.4  oz.) Post-feed weight:  3884 g (8  lb. 9.0  oz.) Amount transferred:  16  ml Amount supplemented:  Not at this  latch , after the 2nd breast   Additional Feeding Assessment -   Infant's oral assessment:  WNL  Positioning:  Cross cradle Re-latched right due to fullness   LATCH documentation:  Latch:  2 = Grasps breast easily, tongue down, lips flanged, rhythmical sucking.  Audible swallowing:  2 = Spontaneous and intermittent  Type of nipple:  2 = Everted at rest and after stimulation  Comfort (Breast/Nipple):  2 = Soft / non-tender  Hold (Positioning):  1 = Assistance needed to correctly position infant at breast and maintain latch( worked on flipping open upper lip /depth )   LATCH score:  9   Attached assessment:  Deep  Lips flanged:  No at 1st , flipped upper lip to flanged position   Lips untucked:  Yes   Suck assessment:  Nutritive  Tools:  Pump ( DEBP Medela t at home - for post pumping  Instructed on use and cleaning of tool:  No.  Pre-feed weight:3884  g  (8 lb.9.0  oz.) Post-feed weight:  3890  g (8 lb. 9.2  oz.) Amount transferred:  6  ml Amount supplemented:  After the 2nd breast   Latch for left breast , mom was independent , latch score of 10 with depth , and baby seems more  consistent with pattern , and more swallows noted. Increased with breast compressions and less non - nutritive sucking noted.  Pre-feed weight - 3890 g , 8-9.2 oz  Post- feed weight - 3902 g , 8-9.7 oz  Amount transferring : 12 ml     Total amount pumped post feed:  R 22  ml    L 12 ml   Total amount transferred:  34  ml Total supplement given:  30 ml of formula  Lactation plan of care -  Praised mom for her efforts breast feeding and pumping                                         - Concerned about milk supply - LC suggested - Focus on the positives , and what she                                           is giving her  Baby , not to focus on what she isn't in the sense of her low milk supply                                         - Extra pumping is still indicated - after 5-6 feedings for 10  mins both breast, if the baby has fed well and not sluggish                                                                                                  -  15 mins if the baby has been sluggish with  The feeding                                         - Continue to supplement with EBM or formula after every feeding , limit feeding at the breast 30 mins max , supplement                                        - Watch for [employer] , hanging out and being non - nutritive                                         - Steps for latching - breast massage , hand express, latch with breast compressions until swallows and then intermittent.                                        - Growth spurts at 3 weeks , 6 weeks , cluster feeding is normal .                                         - Flange size - try changing back to #24 ( due to decrease in edema ) if comfortable , stay with #24, EBM yield should increase.

## 2014-03-24 ENCOUNTER — Encounter (HOSPITAL_COMMUNITY): Payer: Self-pay

## 2014-09-12 ENCOUNTER — Emergency Department (HOSPITAL_COMMUNITY)
Admission: EM | Admit: 2014-09-12 | Discharge: 2014-09-13 | Disposition: A | Discharge status: Home / Self Care | Payer: 59 | Attending: Emergency Medicine | Admitting: Emergency Medicine

## 2014-09-12 ENCOUNTER — Encounter (HOSPITAL_COMMUNITY): Payer: Self-pay | Admitting: *Deleted

## 2014-09-12 ENCOUNTER — Emergency Department (HOSPITAL_COMMUNITY): Payer: 59

## 2014-09-12 DIAGNOSIS — R202 Paresthesia of skin: Secondary | ICD-10-CM | POA: Diagnosis not present

## 2014-09-12 DIAGNOSIS — J45909 Unspecified asthma, uncomplicated: Secondary | ICD-10-CM | POA: Diagnosis not present

## 2014-09-12 DIAGNOSIS — R51 Headache: Secondary | ICD-10-CM

## 2014-09-12 DIAGNOSIS — Z79899 Other long term (current) drug therapy: Secondary | ICD-10-CM | POA: Insufficient documentation

## 2014-09-12 DIAGNOSIS — R519 Headache, unspecified: Secondary | ICD-10-CM

## 2014-09-12 DIAGNOSIS — R200 Anesthesia of skin: Secondary | ICD-10-CM | POA: Diagnosis present

## 2014-09-12 LAB — CBC WITH DIFFERENTIAL/PLATELET
Basophils Absolute: 0 10*3/uL (ref 0.0–0.1)
Basophils Relative: 0 % (ref 0–1)
Eosinophils Absolute: 0 10*3/uL (ref 0.0–0.7)
Eosinophils Relative: 0 % (ref 0–5)
HEMATOCRIT: 40.7 % (ref 36.0–46.0)
HEMOGLOBIN: 13 g/dL (ref 12.0–15.0)
LYMPHS ABS: 1.3 10*3/uL (ref 0.7–4.0)
LYMPHS PCT: 8 % — AB (ref 12–46)
MCH: 25.7 pg — ABNORMAL LOW (ref 26.0–34.0)
MCHC: 31.9 g/dL (ref 30.0–36.0)
MCV: 80.4 fL (ref 78.0–100.0)
MONOS PCT: 1 % — AB (ref 3–12)
Monocytes Absolute: 0.2 10*3/uL (ref 0.1–1.0)
NEUTROS ABS: 15.3 10*3/uL — AB (ref 1.7–7.7)
NEUTROS PCT: 91 % — AB (ref 43–77)
Platelets: 376 10*3/uL (ref 150–400)
RBC: 5.06 MIL/uL (ref 3.87–5.11)
RDW: 14.5 % (ref 11.5–15.5)
WBC: 16.8 10*3/uL — ABNORMAL HIGH (ref 4.0–10.5)

## 2014-09-12 LAB — COMPREHENSIVE METABOLIC PANEL
ALBUMIN: 4.2 g/dL (ref 3.5–5.2)
ALT: 18 U/L (ref 0–35)
ANION GAP: 9 (ref 5–15)
AST: 19 U/L (ref 0–37)
Alkaline Phosphatase: 70 U/L (ref 39–117)
BUN: 19 mg/dL (ref 6–23)
CO2: 25 mmol/L (ref 19–32)
CREATININE: 0.62 mg/dL (ref 0.50–1.10)
Calcium: 9.4 mg/dL (ref 8.4–10.5)
Chloride: 105 mmol/L (ref 96–112)
GFR calc Af Amer: >90 mL/min (ref >90)
GFR calc non Af Amer: >90 mL/min (ref >90)
GLUCOSE: 106 mg/dL — AB (ref 70–99)
Potassium: 4 mmol/L (ref 3.5–5.1)
Sodium: 139 mmol/L (ref 135–145)
TOTAL PROTEIN: 8.1 g/dL (ref 6.0–8.3)
Total Bilirubin: 0.3 mg/dL (ref 0.3–1.2)

## 2014-09-12 MED ORDER — SODIUM CHLORIDE 0.9 % IV SOLN
INTRAVENOUS | Status: DC
  Administered 2014-09-12: 10 mL/h via INTRAVENOUS

## 2014-09-12 MED ORDER — GABAPENTIN 300 MG PO CAPS: 300.0000 mg | ORAL_CAPSULE | Freq: Three times a day (TID) | ORAL | Status: DC

## 2014-09-12 NOTE — ED Provider Notes (Signed)
CSN: [id number]     Arrival date & time 09/12/14  1952 History   First MD Initiated Contact with Patient 09/12/14 2001     No chief complaint on file.    (Consider location/radiation/quality/duration/timing/severity/associated sxs/prior Treatment) HPI Comments: Patient here complaining of paresthesias to the left side of her face as well as left upper extremity. Diagnosis radiculopathy and placed on prednisone and his symptoms have not improved. Denies any headache or blurred vision. States that her eye feels like it's numb. No vision loss. Denies any nausea vomiting. No fever or chills. Denies any gait ataxia. No numbness to her lower extremities. No change in bowel or bladder function. Symptoms have been present for the past 10 days. No prior history of same. No new medication use.  The history is provided by the patient.    Past Medical History  Diagnosis Date  . Normal pregnancy in third trimester 12/25/2013  . Asthma    Past Surgical History  Procedure Laterality Date  . Tonsillectomy     Family History  Problem Relation Age of Onset  . Hypertension Mother    History  Substance Use Topics  . Smoking status: Never Smoker   . Smokeless tobacco: Never Used  . Alcohol Use: No   OB History    Gravida Para Term Preterm AB TAB SAB Ectopic Multiple Living   1 1 1       1      Review of Systems  All other systems reviewed and are negative.     Allergies  Sulfa antibiotics  Home Medications   Prior to Admission medications   Medication Sig Start Date End Date Taking? Authorizing Provider  albuterol (PROVENTIL HFA;VENTOLIN HFA) 108 (90 BASE) MCG/ACT inhaler Inhale 1-2 puffs into the lungs every 6 (six) hours as needed for wheezing or shortness of breath.    Historical Provider, MD  amoxicillin-clavulanate (AUGMENTIN) 875-125 MG per tablet Take 1 tablet by mouth 2 (two) times daily. 01/04/14   Huel Cote, MD  cetirizine (ZYRTEC) 10 MG tablet Take 10 mg by mouth daily.     Historical Provider, MD  ibuprofen (ADVIL,MOTRIN) 600 MG tablet Take 1 tablet (600 mg total) by mouth every 6 (six) hours. 12/29/13   Lavina Hamman, MD  Prenatal Vit-Fe Fumarate-FA (PRENATAL MULTIVITAMIN) TABS tablet Take 1 tablet by mouth daily.    Historical Provider, MD   There were no vitals taken for this visit. Physical Exam  Constitutional: She is oriented to person, place, and time. She appears well-developed and well-nourished.  Non-toxic appearance. No distress.  HENT:  Head: Normocephalic and atraumatic.  Eyes: Conjunctivae, EOM and lids are normal. Pupils are equal, round, and reactive to light.  Neck: Normal range of motion. Neck supple. No tracheal deviation present. No thyroid mass present.  Cardiovascular: Normal rate, regular rhythm and normal heart sounds.  Exam reveals no gallop.   No murmur heard. Pulmonary/Chest: Effort normal and breath sounds normal. No stridor. No respiratory distress. She has no decreased breath sounds. She has no wheezes. She has no rhonchi. She has no rales.  Abdominal: Soft. Normal appearance and bowel sounds are normal. She exhibits no distension. There is no tenderness. There is no rebound and no CVA tenderness.  Musculoskeletal: Normal range of motion. She exhibits no edema or tenderness.  Neurological: She is alert and oriented to person, place, and time. She has normal strength. No cranial nerve deficit or sensory deficit. GCS eye subscore is 4. GCS verbal subscore is 5. GCS motor  subscore is 6.  Skin: Skin is warm and dry. No abrasion and no rash noted.  Psychiatric: She has a normal mood and affect. Her speech is normal and behavior is normal.  Nursing note and vitals reviewed.   ED Course  Procedures (including critical care time) Labs Review Labs Reviewed  CBC WITH DIFFERENTIAL/PLATELET  COMPREHENSIVE METABOLIC PANEL    Imaging Review No results found.   EKG Interpretation None      MDM   Final diagnoses:  None     Patient to be seen by neurology here in ED    Lorre Nick, MD 09/12/14 [phone]

## 2014-09-12 NOTE — ED Notes (Signed)
Pt complains of numbness in her left arm and face for the past week. Pt states the numbness spreads over to the right side of her face. Pt also complains of some lapses in memory. Pt states "I can't even remember driving here today".

## 2014-09-13 DIAGNOSIS — R202 Paresthesia of skin: Secondary | ICD-10-CM

## 2014-09-13 NOTE — Consult Note (Signed)
Neurology Consultation Reason for Consult: Paresthesia Referring Physician: Freida Busman, a  CC: Paresthesia  History is obtained from: Patient, husband  HPI: Olivia Rogers is a 34 y.o. female with a history of numbness and tingling that started initially on her left arm couple weeks ago. It has been continuing up her arm then involved the left side of her face as well. Since then it has moved to involve both sides of her face as well as both arms. She states that this comes and goes and there is tingling as well as numbness.  She does have a history of headaches which she describes as "sinus headaches." They occur relatively suddenly and last for 30-45 minutes if she takes medication. They're associated with photophobia and nausea.   She denies increased in frequency in headaches in the past couple of weeks.  She had an MRI done in the emergency room which showed some nonspecific white matter changes.   She denies any changes in her bowel and bladder. She denies any previous episodes of similar symptoms of weakness or numbness. She denies any history of unilateral vision loss.  She was started on prednisone for possible radiculopathy  ROS: A 14 point ROS was performed and is negative except as noted in the HPI.   Past Medical History  Diagnosis Date  . Normal pregnancy in third trimester 12/25/2013  . Asthma     Family History: No history of autoimmune disease to her knowledge  Social History: Tob: Denies  Exam: Current vital signs: BP 139/92 mmHg  Pulse 74  Temp(Src) 98.4 F (36.9 C) (Oral)  Resp 16  SpO2 96% Vital signs in last 24 hours: Temp:  [98.4 F (36.9 C)] 98.4 F (36.9 C) (04/22 2016) Pulse Rate:  [74-79] 74 (04/23 0004) Resp:  [16-20] 16 (04/23 0004) BP: (139)/(92) 139/92 mmHg (04/22 2016) SpO2:  [96 %] 96 % (04/23 0004)   Physical Exam  Constitutional: Appears well-developed and well-nourished.  Psych: Affect appropriate to situation Eyes: No scleral  injection HENT: No OP obstrucion Head: Normocephalic.  Cardiovascular: Normal rate and regular rhythm.  Respiratory: Effort normal  GI: Soft.  No distension.   Skin: WDI  Neuro: Mental Status: Patient is awake, alert, oriented to person, place, month, year, and situation. Patient is able to give a clear and coherent history. No signs of aphasia or neglect Cranial Nerves: II: Visual Fields are full. Pupils are equal, round, and reactive to light.  Fundi are sharp. III,IV, VI: EOMI without ptosis or diploplia.  V: Facial sensation is symmetric, but associated with paresthesia(feeling tingly) VII: Facial movement is symmetric.  VIII: hearing is intact to voice X: Uvula elevates symmetrically XI: Shoulder shrug is symmetric. XII: tongue is midline without atrophy or fasciculations.  Motor: Tone is normal. Bulk is normal. 5/5 strength was present in all four extremities.  Sensory: Sensation is symmetric to light touch  the arms and legs. Deep Tendon Reflexes: 2+ and symmetric in the biceps and patellae.  Cerebellar: FNF  intact bilaterally     I have reviewed labs in epic and the results pertinent to this consultation are: CMP-unremarkable  I have reviewed the images obtained: MRI brain-multiple nonspecific white matter lesions of the bilateral cerebral hemispheres  Impression: 34 year old female with waxing and waning bilateral paresthesias. The character of this, specifically the fact that it is coming and going, as well as the fact that does spread to be bilateral be extremely unusual for an MS flare. No signs of stroke, cord  lesion, or other explanation on her MRI. If her symptoms were due to a vasculitic process, then I would expect to see diffusion changes and would expect more headache.  One possibility that I think needs to be considered would be persistent migraine aura and I would favor no further workup at this time, however she has persistent symptoms then checking for  oligoclonal bands may be prudent  Recommendations: 1) gabapentin 300 mg 3 times a day 2) would continue steroids for now 3) follow-up as outpatient with neurology. 4) I advised her that with any worsening, especially any signs of motor involvement she should return to the emergency room immediately.  Ritta Slot, MD [facility] [phone]  If 7pm- 7am, please page neurology on call as listed in AMION.

## 2014-09-13 NOTE — Discharge Instructions (Signed)
[facility] for any worsening symptoms

## 2014-09-16 ENCOUNTER — Ambulatory Visit (INDEPENDENT_AMBULATORY_CARE_PROVIDER_SITE_OTHER): Payer: 59 | Admitting: Neurology

## 2014-09-16 ENCOUNTER — Encounter: Payer: Self-pay | Admitting: Neurology

## 2014-09-16 VITALS — BP 122/84 | HR 78 | Temp 98.5°F | Ht 66.0 in | Wt 243.0 lb

## 2014-09-16 DIAGNOSIS — R7302 Impaired glucose tolerance (oral): Secondary | ICD-10-CM

## 2014-09-16 DIAGNOSIS — I639 Cerebral infarction, unspecified: Secondary | ICD-10-CM

## 2014-09-16 DIAGNOSIS — D72829 Elevated white blood cell count, unspecified: Secondary | ICD-10-CM

## 2014-09-16 DIAGNOSIS — G35 Multiple sclerosis: Secondary | ICD-10-CM

## 2014-09-16 DIAGNOSIS — R208 Other disturbances of skin sensation: Secondary | ICD-10-CM | POA: Diagnosis not present

## 2014-09-16 DIAGNOSIS — G4089 Other seizures: Secondary | ICD-10-CM

## 2014-09-16 DIAGNOSIS — R202 Paresthesia of skin: Secondary | ICD-10-CM | POA: Diagnosis not present

## 2014-09-16 DIAGNOSIS — R200 Anesthesia of skin: Secondary | ICD-10-CM | POA: Insufficient documentation

## 2014-09-16 DIAGNOSIS — R29898 Other symptoms and signs involving the musculoskeletal system: Secondary | ICD-10-CM | POA: Diagnosis not present

## 2014-09-16 DIAGNOSIS — R930 Abnormal findings on diagnostic imaging of skull and head, not elsewhere classified: Secondary | ICD-10-CM | POA: Diagnosis not present

## 2014-09-16 DIAGNOSIS — R9082 White matter disease, unspecified: Secondary | ICD-10-CM | POA: Insufficient documentation

## 2014-09-16 DIAGNOSIS — I776 Arteritis, unspecified: Secondary | ICD-10-CM | POA: Diagnosis not present

## 2014-09-16 DIAGNOSIS — I6340 Cerebral infarction due to embolism of unspecified cerebral artery: Secondary | ICD-10-CM

## 2014-09-16 NOTE — Patient Instructions (Addendum)
Overall you are doing fairly well but I do want to suggest a few things today:   Remember to drink plenty of fluid, eat healthy meals and do not skip any meals. Try to eat protein with a every meal and eat a healthy snack such as fruit or nuts in between meals. Try to keep a regular sleep-wake schedule and try to exercise daily, particularly in the form of walking, 20-30 minutes a day, if you can.   As far as your medications are concerned, I would like to suggest: Daily baby aspirin  As far as diagnostic testing:  eeg Emg/ncs Carotid dopplers Echo MRi w/wo contrast  MRA of the head Will consider LP after workup    I would like to see you back after workup, sooner if we need to. Please call us with any interim questions, concerns, problems, updates or refill requests.   Please also call us for any test results so we can go over those with you on the phone.  My clinical assistant and will answer any of your questions and relay your messages to me and also relay most of my messages to you.   Our phone number is [phone]. We also have an after hours call service for urgent matters and there is a physician on-call for urgent questions. For any emergencies you know to call 911 or go to the nearest emergency room

## 2014-09-16 NOTE — Progress Notes (Signed)
[facility]    Provider:  Dr Lucia Gaskins Referring Provider: Lorre Nick, MD Primary Care Physician:  No primary care provider on file.  CC:  paresthesias  HPI:  Olivia Rogers is a 34 y.o. female here as a referral from Dr. Freida Busman for paresthesias, facial paresthesias with confusion. Started a month ago when she had pain in the left shoulder, then progressed into the pectoral area nad neck, numbness and tingling. Then a week later had acute onset left-sided facial numbness then the right side of the face.She was confused/altered with the onset of the facial numbness. She was sent home from work because she seemed confused.  Prednisone and flexeril did not help with the shoulder and neck pain, cervical pillow did not help.  Symptoms have been slowly progressive, worsening over the last month. Now having right side finger numbness mostly in the ulnar distribution. Majority of symptoms in the face and neck. 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 Had some weakness in left grip with  Improvement after position. Mom with sarcoidosis. No FHx migraines. She had headaches as a child. She has had headaches behind the eyes, with migrainous symptoms of photophobia and nausea. No vision changes, recent illnesses, bowel or bladder changes.  Reviewed notes, labs and imaging from outside physicians, which showed: She was seen in the ED on 4/22 for paresthesias of the left face and left extremity. She was Dxed with cervical radiculopathy without improvement. Neurology was consulted, suspected migraine aura. Started on gabapentin. CMP unremarkable. CBC with elevated WBCs with neutrophil predominance.   Personally reviewed imaging and agree with below:  MRI head:  1. Scattered T2/FLAIR hyperintense foci involving the periventricular and deep white matter of both cerebral hemispheres. These foci are nonspecific. Differential considerations  include underlying demyelinating disease, chronic small vessel ischemic changes, or possibly vasculitis. Foci such as these have also been described with migrainous disorders as well.  MRi cervical spine:  1. Normal MRI appearance of the cervical spinal cord. 2. Very minimal degenerative uncovertebral hypertrophy at C4-5 and C5-6. No significant stenosis identified within the cervical spine.    Review of Systems: Patient complains of symptoms per HPI as well as the following symptoms: confusion, numbness, joint pain, aching muscles, anxiety. Pertinent negatives per HPI. All others negative.   History   Social History  . Marital Status: Married    Spouse Name: Minerva Areola  . Number of Children: 1  . Years of Education: MSN   Occupational History  . Registered Nurse [employer]   Social History Main Topics  . Smoking status: Never Smoker   . Smokeless tobacco: Never Used  . Alcohol Use: No     Comment: Quit in 2014  . Drug Use: No  . Sexual Activity: Yes   Other Topics Concern  . Not on file   Social History Narrative   Lives at home with husband and son.   Caffeine use: 4 cups coffee per week.    Family History  Problem Relation Age of Onset  . Hypertension Mother   . Bell's palsy Mother   . Sarcoidosis Mother   . Diabetes type II Mother   . Migraines Neg Hx     Past Medical History  Diagnosis Date  . Normal pregnancy in third trimester 12/25/2013  . Asthma   . Depression   . Anxiety   . Post partum depression     Past Surgical History  Procedure Laterality  Date  . Tonsillectomy    . Tympanoplasty Left 1993    Current Outpatient Prescriptions  Medication Sig Dispense Refill  . albuterol (PROVENTIL HFA;VENTOLIN HFA) 108 (90 BASE) MCG/ACT inhaler Inhale 1-2 puffs into the lungs every 6 (six) hours as needed for wheezing or shortness of breath.    . gabapentin (NEURONTIN) 300 MG capsule Take 1 capsule (300 mg total) by mouth 3 (three) times daily. 90 capsule 0   . ibuprofen (ADVIL,MOTRIN) 600 MG tablet Take 1 tablet (600 mg total) by mouth every 6 (six) hours. 30 tablet 0  . lamoTRIgine (LAMICTAL) 150 MG tablet Take 150 mg by mouth at bedtime.    . Levonorgestrel 13.5 MG IUD by Intrauterine route.    . predniSONE (DELTASONE) 10 MG tablet Take 10 mg by mouth. 12-day taper dose pack.    . cetirizine (ZYRTEC) 10 MG tablet Take 10 mg by mouth daily.     No current facility-administered medications for this visit.    Allergies as of 09/16/2014 - Review Complete 09/16/2014  Allergen Reaction Noted  . Sulfa antibiotics Nausea And Vomiting 12/25/2013    Vitals: BP 122/84 mmHg  Pulse 78  Temp(Src) 98.5 F (36.9 C)  Ht  (1.676 m)  Wt 243 lb (110.224 kg)  BMI 39.24 kg/m2 Last Weight:  Wt Readings from Last 1 Encounters:  09/16/14 243 lb (110.224 kg)   Last Height:   Ht Readings from Last 1 Encounters:  09/16/14  (1.676 m)   Physical exam: Exam: Gen: NAD, conversant, well nourised, well groomed                     CV: RRR, no MRG. No Carotid Bruits. No peripheral edema, warm, nontender Eyes: Conjunctivae clear without exudates or hemorrhage  Neuro: Detailed Neurologic Exam  Speech:    Speech is normal; fluent and spontaneous with normal comprehension.  Cognition:    The patient is oriented to person, place, and time;     recent and remote memory intact;     language fluent;     normal attention, concentration,     fund of knowledge Cranial Nerves:    The pupils are equal, round, and reactive to light. The fundi are normal and spontaneous venous pulsations are present. Visual fields are full to finger confrontation. Extraocular movements are intact. Trigeminal sensation is intact and the muscles of mastication are normal. The face is symmetric. The palate elevates in the midline. Hearing intact. Voice is normal. Shoulder shrug is normal. The tongue has normal motion without fasciculations.   Coordination:    Normal finger to  nose and heel to shin. Normal rapid alternating movements.   Gait:    Heel-toe and tandem gait are normal.   Motor Observation:    No asymmetry, no atrophy, and no involuntary movements noted. Tone:    Normal muscle tone.    Posture:    Posture is normal. normal erect    Strength:    Strength is V/V in the upper and lower limbs.      Sensation: intact to LT     Reflex Exam:  DTR's:    Deep tendon reflexes in the upper and lower extremities are normal bilaterally.   Toes:    The toes are downgoing bilaterally.   Clonus:    Clonus is absent.    Assessment/Plan:  34 year old female with paresthesias in the face and arms. MRI with non-specific white matter changes. Differential includes persistent migraine  aura vs sensory seizure vs causes of white matter change in the brain including demyelinating disease/inflammatory disease/vasculitis.  eeg for eval of sensory seizures Emg/ncs to evaluate paresthesias in the left arm  Will order labwork For abnormal white matter changes: Carotid dopplers, Echo, MRi w/wo contrast with MS protocol,  MRA of the head  Consider LP after workup Daily baby aspirin for stroke prevention Leukocytosis - can be due to steroids, but wbcs also elevated 8 months ago. Neutrophil redominance. Will recheck.      Naomie Dean, MD  [facility] [address] [place], [place] [address]  Phone [phone] Fax [phone]

## 2014-09-17 ENCOUNTER — Encounter: Payer: Self-pay | Admitting: Neurology

## 2014-09-17 ENCOUNTER — Telehealth: Payer: Self-pay | Admitting: Neurology

## 2014-09-17 NOTE — Telephone Encounter (Signed)
We can hold off on the cardiac workup until the MRI and EEG are completed. However I do think they are important and recommend they be completed at some point. Please let her know, thanks.

## 2014-09-17 NOTE — Telephone Encounter (Signed)
Patient called wanting to confirm with Dr. Lucia Gaskins to see if the cardiac testings are necessary for her to have because if not she would rather just focus on the EEG and MRI. Please call and advice. # [phone]

## 2014-09-18 ENCOUNTER — Other Ambulatory Visit (HOSPITAL_COMMUNITY)
Admission: RE | Admit: 2014-09-18 | Discharge: 2014-09-18 | Disposition: A | Discharge status: Home / Self Care | Payer: 59 | Source: Ambulatory Visit | Attending: Neurology | Admitting: Neurology

## 2014-09-18 ENCOUNTER — Ambulatory Visit (INDEPENDENT_AMBULATORY_CARE_PROVIDER_SITE_OTHER): Payer: 59

## 2014-09-18 DIAGNOSIS — R930 Abnormal findings on diagnostic imaging of skull and head, not elsewhere classified: Secondary | ICD-10-CM

## 2014-09-18 DIAGNOSIS — D72829 Elevated white blood cell count, unspecified: Secondary | ICD-10-CM

## 2014-09-18 DIAGNOSIS — I639 Cerebral infarction, unspecified: Secondary | ICD-10-CM

## 2014-09-18 DIAGNOSIS — R208 Other disturbances of skin sensation: Secondary | ICD-10-CM | POA: Insufficient documentation

## 2014-09-18 DIAGNOSIS — I776 Arteritis, unspecified: Secondary | ICD-10-CM

## 2014-09-18 DIAGNOSIS — G35 Multiple sclerosis: Secondary | ICD-10-CM

## 2014-09-18 DIAGNOSIS — R202 Paresthesia of skin: Secondary | ICD-10-CM | POA: Insufficient documentation

## 2014-09-18 DIAGNOSIS — R200 Anesthesia of skin: Secondary | ICD-10-CM

## 2014-09-18 DIAGNOSIS — R29898 Other symptoms and signs involving the musculoskeletal system: Secondary | ICD-10-CM | POA: Insufficient documentation

## 2014-09-18 DIAGNOSIS — G4089 Other seizures: Secondary | ICD-10-CM

## 2014-09-18 DIAGNOSIS — R7302 Impaired glucose tolerance (oral): Secondary | ICD-10-CM

## 2014-09-18 DIAGNOSIS — R9082 White matter disease, unspecified: Secondary | ICD-10-CM

## 2014-09-18 LAB — CBC WITH DIFFERENTIAL/PLATELET
BASOS PCT: 0 % (ref 0–1)
Basophils Absolute: 0 10*3/uL (ref 0.0–0.1)
EOS ABS: 0.1 10*3/uL (ref 0.0–0.7)
Eosinophils Relative: 1 % (ref 0–5)
HEMATOCRIT: 42.9 % (ref 36.0–46.0)
HEMOGLOBIN: 13.3 g/dL (ref 12.0–15.0)
Lymphocytes Relative: 13 % (ref 12–46)
Lymphs Abs: 2.3 10*3/uL (ref 0.7–4.0)
MCH: 25.5 pg — ABNORMAL LOW (ref 26.0–34.0)
MCHC: 31 g/dL (ref 30.0–36.0)
MCV: 82.3 fL (ref 78.0–100.0)
MONO ABS: 0.3 10*3/uL (ref 0.1–1.0)
MONOS PCT: 2 % — AB (ref 3–12)
Neutro Abs: 14.9 10*3/uL — ABNORMAL HIGH (ref 1.7–7.7)
Neutrophils Relative %: 84 % — ABNORMAL HIGH (ref 43–77)
Platelets: 449 10*3/uL — ABNORMAL HIGH (ref 150–400)
RBC: 5.21 MIL/uL — ABNORMAL HIGH (ref 3.87–5.11)
RDW: 15.2 % (ref 11.5–15.5)
WBC: 17.6 10*3/uL — ABNORMAL HIGH (ref 4.0–10.5)

## 2014-09-18 LAB — SEDIMENTATION RATE: SED RATE: 7 mm/h (ref 0–22)

## 2014-09-18 LAB — VITAMIN B12: Vitamin B-12: 747 pg/mL (ref 211–911)

## 2014-09-18 LAB — TSH: TSH: 2.074 u[IU]/mL (ref 0.350–4.500)

## 2014-09-18 LAB — FOLATE: Folate: 7.6 ng/mL

## 2014-09-18 MED ORDER — GADOPENTETATE DIMEGLUMINE 469.01 MG/ML IV SOLN: 20.0000 mL | Freq: Once | INTRAVENOUS | Status: AC | PRN

## 2014-09-18 NOTE — Telephone Encounter (Signed)
Talked with patient to let her know she can get MRI and EEG and hold off on the cardiac workup but Dr. Lucia Gaskins still recommends she has the cardiac workup. Pt stated she is going to have cardiac workup completed still. Told her to call back if she had any more questions.

## 2014-09-19 ENCOUNTER — Encounter (HOSPITAL_COMMUNITY): Payer: Self-pay | Admitting: General Practice

## 2014-09-19 DIAGNOSIS — Z302 Encounter for sterilization: Secondary | ICD-10-CM

## 2014-09-19 HISTORY — DX: Encounter for sterilization: Z30.2

## 2014-09-19 LAB — HEPATITIS C ANTIBODY: HCV AB: NEGATIVE

## 2014-09-19 NOTE — Telephone Encounter (Signed)
Patient called stating that she called [facility] on [address] to see if th order was sent for her to have a cardiac workup but they had not received anything yet. Please call and advice # [phone]

## 2014-09-19 NOTE — Telephone Encounter (Signed)
They were both ordered on 4/26th. See me on Monday thanks!

## 2014-09-20 LAB — HEMOGLOBIN A1C
Hgb A1c MFr Bld: 5.8 % — ABNORMAL HIGH (ref 4.8–5.6)
MEAN PLASMA GLUCOSE: 120 mg/dL

## 2014-09-20 NOTE — Telephone Encounter (Signed)
Olivia Rogers - Let's talk on Monday. She just needs an echo of the heart with bubble study. She doesn't need carotid dopplers. The order is out there. thanks

## 2014-09-22 ENCOUNTER — Telehealth: Payer: Self-pay | Admitting: *Deleted

## 2014-09-22 NOTE — Telephone Encounter (Signed)
Spoke with patient to let her know that Dr. Lucia Gaskins no longer wants her to complete the carotid doppler but would like her to have an echo w/ bubble study at [facility]. Pt stated they already called and schedule her for this Friday, Sep 26, 2014 at 9:00am. 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

## 2014-09-22 NOTE — Telephone Encounter (Signed)
Thank you :)

## 2014-09-24 ENCOUNTER — Ambulatory Visit (INDEPENDENT_AMBULATORY_CARE_PROVIDER_SITE_OTHER): Payer: 59 | Admitting: Neurology

## 2014-09-24 DIAGNOSIS — R202 Paresthesia of skin: Secondary | ICD-10-CM | POA: Diagnosis not present

## 2014-09-24 DIAGNOSIS — R29898 Other symptoms and signs involving the musculoskeletal system: Secondary | ICD-10-CM

## 2014-09-24 DIAGNOSIS — R200 Anesthesia of skin: Secondary | ICD-10-CM

## 2014-09-24 DIAGNOSIS — R208 Other disturbances of skin sensation: Secondary | ICD-10-CM | POA: Diagnosis not present

## 2014-09-24 DIAGNOSIS — Z0289 Encounter for other administrative examinations: Secondary | ICD-10-CM

## 2014-09-24 DIAGNOSIS — G4089 Other seizures: Secondary | ICD-10-CM

## 2014-09-24 NOTE — Procedures (Signed)
    History:  Olivia Rogers is a 34 year old patient with a history of intermittent episodes of facial paresthesias associated with confusion. The patient being evaluated for these events.  This is a routine EEG. No skull defects are noted. Medications include albuterol inhaler, gabapentin, ibuprofen, Lamictal, and prednisone.   EEG classification: Normal awake and asleep  Description of the recording: The background rhythms of this recording consists of a fairly well modulated medium amplitude background activity of 10 Hz. As the record progresses, the patient initially is in the waking state, but appears to enter the early stage II sleep during the recording, with rudimentary sleep spindles and vertex sharp wave activity seen. During the wakeful state, photic stimulation is performed, and this results in a bilateral and symmetric photic driving response. Hyperventilation was also performed, and this results in a minimal buildup of the background rhythm activities without significant slowing seen. At no time during the recording does there appear to be evidence of spike or spike wave discharges or evidence of focal slowing. EKG monitor shows no evidence of cardiac rhythm abnormalities with a heart rate of 72.  Impression: This is a normal EEG recording in the waking and sleeping state. No evidence of ictal or interictal discharges were seen at any time during the recording.

## 2014-09-25 ENCOUNTER — Telehealth: Payer: Self-pay | Admitting: *Deleted

## 2014-09-25 NOTE — Telephone Encounter (Signed)
Left message for patient to call back. Gave [facility] office hours and phone number. I was calling about results.

## 2014-09-26 ENCOUNTER — Encounter (HOSPITAL_COMMUNITY): Payer: Self-pay

## 2014-09-26 ENCOUNTER — Ambulatory Visit (HOSPITAL_COMMUNITY): Payer: 59 | Attending: Cardiology

## 2014-09-26 ENCOUNTER — Other Ambulatory Visit: Payer: Self-pay

## 2014-09-26 DIAGNOSIS — I6340 Cerebral infarction due to embolism of unspecified cerebral artery: Secondary | ICD-10-CM

## 2014-09-26 DIAGNOSIS — R9082 White matter disease, unspecified: Secondary | ICD-10-CM

## 2014-09-26 DIAGNOSIS — R930 Abnormal findings on diagnostic imaging of skull and head, not elsewhere classified: Secondary | ICD-10-CM | POA: Diagnosis not present

## 2014-09-26 DIAGNOSIS — I639 Cerebral infarction, unspecified: Secondary | ICD-10-CM

## 2014-09-26 NOTE — Procedures (Signed)
[facility]    Provider:  Dr Lucia Gaskins  HPI: Olivia Rogers is a 34 y.o. female here as a referral from Dr. Freida Busman for paresthesias, facial paresthesias with confusion. Started a month ago when she had pain in the left shoulder, then progressed into the pectoral area nad neck, numbness and tingling. Then a week later had acute onset left-sided facial numbness then the right side of the face.She was confused/altered with the onset of the facial numbness. She was sent home from work because she seemed confused. Prednisone and flexeril did not help with the shoulder and neck pain, cervical pillow did not help. Symptoms have been slowly progressive, worsening over the last month. Now having right side finger numbness mostly in the ulnar distribution. Majority of symptoms in the face and neck. 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 Had some weakness in left grip with Improvement after position. Mom with sarcoidosis. No FHx migraines. She had headaches as a child. She has had headaches behind the eyes, with migrainous symptoms of photophobia and nausea. No vision changes, recent illnesses, bowel or bladder changes.  Summary  Nerve conduction studies were performed on the left upper and left lower extremities:  The left APB Median motor nerve showed normal conductions with normal F Wave latency The left ADM Ulnar motor nerve showed normal conductions with normal F Wave latency The left second-digit Median sensory nerve conduction was within normal limits The left fifth-digit Ulnar sensory nerve conduction was within normal limits The left Radial sensory nerve conduction was within normal limits The left Median/Ulnar (palm) comparison nerve showed normal peak latency difference  EMG Needle study was performed on selected left upper extremity muscles:   The Deltoid, Triceps, Pronator Teres, Flexor Digitorum profundus (Ulnar head),  Opponens Pollicis, First Dorsal interosseous, C7/C8 muscles were within normal limits.  Conclusion: This is a normal study. No electrophysiologic evidence for ulnar or median neuropathy, peripheral polyneuropathy or radiculopathy.  Naomie Dean, MD  [facility] [address] [place], [place] [address]  Phone [phone] Fax [phone]

## 2014-09-26 NOTE — Progress Notes (Signed)
See procedure note.

## 2014-09-26 NOTE — Telephone Encounter (Signed)
Pt was informed that her EEG results were normal

## 2014-09-26 NOTE — Progress Notes (Signed)
IV 22G (R) Wrist x 1 for Bubble Study, tolerated well, IV D'ced,site unremarkable. Randa Evens, Tiron Suski A

## 2014-09-26 NOTE — Progress Notes (Signed)
  [facility]    Provider:  Dr Lucia Gaskins  HPI: Olivia Rogers is a 34 y.o. female here as a referral from Dr. Freida Busman for paresthesias, facial paresthesias with confusion. Started a month ago when she had pain in the left shoulder, then progressed into the pectoral area nad neck, numbness and tingling. Then a week later had acute onset left-sided facial numbness then the right side of the face.She was confused/altered with the onset of the facial numbness. She was sent home from work because she seemed confused. Prednisone and flexeril did not help with the shoulder and neck pain, cervical pillow did not help. Symptoms have been slowly progressive, worsening over the last month. Now having right side finger numbness mostly in the ulnar distribution. Majority of symptoms in the face and neck. 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 Had some weakness in left grip with Improvement after position. Mom with sarcoidosis. No FHx migraines. She had headaches as a child. She has had headaches behind the eyes, with migrainous symptoms of photophobia and nausea. No vision changes, recent illnesses, bowel or bladder changes.  Summary  Nerve conduction studies were performed on the left upper and left lower extremities:  The left APB Median motor nerve showed normal conductions with normal F Wave latency The left ADM Ulnar motor nerve showed normal conductions with normal F Wave latency The left second-digit Median sensory nerve conduction was within normal limits The left fifth-digit Ulnar sensory nerve conduction was within normal limits The left Radial sensory nerve conduction was within normal limits The left Median/Ulnar (palm) comparison nerve showed normal peak latency difference  EMG Needle study was performed on selected left upper extremity muscles:   The Deltoid, Triceps, Pronator Teres, Flexor Digitorum profundus (Ulnar head),  Opponens Pollicis, First Dorsal interosseous, C7/C8 muscles were within normal limits.  Conclusion: This is a normal study. No electrophysiologic evidence for ulnar or median neuropathy, peripheral polyneuropathy or radiculopathy.  Olivia Dean, MD  [facility] [address] [place], [place] [address]  Phone [phone] Fax [phone]

## 2014-09-30 ENCOUNTER — Telehealth: Payer: Self-pay | Admitting: *Deleted

## 2014-09-30 NOTE — Telephone Encounter (Signed)
Talked with patient to let her know the echo of her heart with bubble study was normal. Pt wondering if Dr. Lucia Gaskins found out if the rest of her lab work came back. She spoke with Dr. Lucia Gaskins about this on 09/26/14. I told her I would ask Dr. Lucia Gaskins about it and give her a call back.

## 2014-09-30 NOTE — Telephone Encounter (Signed)
Would you please let patient know, thank you.

## 2014-09-30 NOTE — Telephone Encounter (Signed)
Left message for patient to call us back. Gave [facility] phone number. Was calling about results.

## 2014-09-30 NOTE — Telephone Encounter (Signed)
Patient called/returning Olivia Rogers's call regarding lab results. Please call and advise. Patient can be reached @ [phone]

## 2014-09-30 NOTE — Telephone Encounter (Signed)
Dr. Lucia Gaskins, Talked with Carollee Herter Lennox Laity) Isom and she put in a ticket to see why we cannot see rest of pt lab results. [employer] is the program the hospital uses, and is not the agency.

## 2014-09-30 NOTE — Telephone Encounter (Signed)
Talked with patient to let her know I contacted the IT department and they stated they are looking into the situation and I would call her once I know more. She wanted to know if Dr. Lucia Gaskins wanted to move forward with a lumbar puncture. I told her I would send Dr. Lucia Gaskins a message and get back with her. Pt verbalized understanding.

## 2014-10-01 ENCOUNTER — Ambulatory Visit (HOSPITAL_COMMUNITY): Admission: RE | Admit: 2014-10-01 | Payer: 59 | Source: Ambulatory Visit | Admitting: Obstetrics and Gynecology

## 2014-10-01 ENCOUNTER — Encounter (HOSPITAL_COMMUNITY): Admission: RE | Payer: Self-pay | Source: Ambulatory Visit

## 2014-10-01 HISTORY — DX: Encounter for sterilization: Z30.2

## 2014-10-01 SURGERY — LIGATION, FALLOPIAN TUBE, LAPAROSCOPIC
Anesthesia: Choice | Laterality: Bilateral

## 2014-10-03 NOTE — Telephone Encounter (Signed)
Left anohter message for patient today. Will try over the weekend

## 2014-10-06 ENCOUNTER — Ambulatory Visit (INDEPENDENT_AMBULATORY_CARE_PROVIDER_SITE_OTHER): Payer: 59 | Admitting: Neurology

## 2014-10-06 ENCOUNTER — Encounter: Payer: Self-pay | Admitting: Neurology

## 2014-10-06 ENCOUNTER — Telehealth: Payer: Self-pay | Admitting: *Deleted

## 2014-10-06 VITALS — BP 131/85 | HR 78

## 2014-10-06 DIAGNOSIS — R202 Paresthesia of skin: Secondary | ICD-10-CM

## 2014-10-06 DIAGNOSIS — G43109 Migraine with aura, not intractable, without status migrainosus: Secondary | ICD-10-CM

## 2014-10-06 LAB — B. BURGDORFI ANTIBODIES

## 2014-10-06 LAB — MPO/PR-3 (ANCA) ANTIBODIES

## 2014-10-06 LAB — ANCA TITERS

## 2014-10-06 LAB — ANTINUCLEAR ANTIBODIES, IFA

## 2014-10-06 LAB — RPR: RPR: NONREACTIVE

## 2014-10-06 LAB — ANGIOTENSIN CONVERTING ENZYME

## 2014-10-06 LAB — HIV ANTIBODY (ROUTINE TESTING W REFLEX): HIV Screen 4th Generation wRfx: NONREACTIVE

## 2014-10-06 LAB — RHEUMATOID FACTOR: Rhuematoid fact SerPl-aCnc: 7.5

## 2014-10-06 MED ORDER — PROPRANOLOL HCL ER 80 MG PO CP24: 80.0000 mg | ORAL_CAPSULE | Freq: Every day | ORAL | Status: DC

## 2014-10-06 MED ORDER — PREGABALIN 50 MG PO CAPS: 50.0000 mg | ORAL_CAPSULE | Freq: Three times a day (TID) | ORAL | Status: DC

## 2014-10-06 NOTE — Patient Instructions (Signed)
Overall you are doing fairly well but I do want to suggest a few things today:   Remember to drink plenty of fluid, eat healthy meals and do not skip any meals. Try to eat protein with a every meal and eat a healthy snack such as fruit or nuts in between meals. Try to keep a regular sleep-wake schedule and try to exercise daily, particularly in the form of walking, 20-30 minutes a day, if you can.   As far as your medications are concerned, I would like to suggest: Inderal 80mg  in the morning dai;ly  I would like to see you back in 6 months, sooner if we need to. Please call us with any interim questions, concerns, problems, updates or refill requests.   Please also call us for any test results so we can go over those with you on the phone.  My clinical assistant and will answer any of your questions and relay your messages to me and also relay most of my messages to you.   Our phone number is [phone]. We also have an after hours call service for urgent matters and there is a physician on-call for urgent questions. For any emergencies you know to call 911 or go to the nearest emergency room

## 2014-10-06 NOTE — Progress Notes (Signed)
[facility]  Provider: Dr Lucia Gaskins Referring Provider: Lorre Nick, MD Primary Care Physician: No primary care provider on file.  CC: paresthesias  She had a headache yesterday with her symptoms. Left sided with the facial numbness. Had light sensitivity, sound sensitivity, no nausea or vomiting. The symptoms continue as stated below. Discussed all the labs and procedures and images that were performed, extensive workup has been negative. Discussed that this is likely persistent migraine aura or part of migrainous process.  Interval update 10/06/2014:   Extensive workup has been negative.  nml labs: hgba1c, sed, tsh, folate, b12, hiv,rpr,ace,lyme,pan-anca, ana,rf,cbc MR brain w/wo contrast showed no enhancing lesions MRA head was normal MR cervical spine unremarkable Emg/ncs nml Echo with bubble nml eeg nml Carotids nml  Initial visit 4/26: Olivia Rogers is a 34 y.o. female here as a referral from Dr. Freida Busman for paresthesias, facial paresthesias with confusion. Started a month ago when she had pain in the left shoulder, then progressed into the pectoral area nad neck, numbness and tingling. Then a week later had acute onset left-sided facial numbness then the right side of the face.She was confused/altered with the onset of the facial numbness. She was sent home from work because she seemed confused. Prednisone and flexeril did not help with the shoulder and neck pain, cervical pillow did not help. Symptoms have been slowly progressive, worsening over the last month. Now having right side finger numbness mostly in the ulnar distribution. Majority of symptoms in the face and neck. 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 Had some weakness in left grip with Improvement after position. Mom with sarcoidosis. No FHx migraines. She had headaches as a child. She has had headaches behind the eyes, with migrainous symptoms of  photophobia and nausea. No vision changes, recent illnesses, bowel or bladder changes.  Reviewed notes, labs and imaging from outside physicians, which showed: She was seen in the ED on 4/22 for paresthesias of the left face and left extremity. She was Dxed with cervical radiculopathy without improvement. Neurology was consulted, suspected migraine aura. Started on gabapentin. CMP unremarkable. CBC with elevated WBCs with neutrophil predominance.   Personally reviewed imaging and agree with below:  MRI head:  1. Scattered T2/FLAIR hyperintense foci involving the periventricular and deep white matter of both cerebral hemispheres. These foci are nonspecific. Differential considerations include underlying demyelinating disease, chronic small vessel ischemic changes, or possibly vasculitis. Foci such as these have also been described with migrainous disorders as well.  MRi cervical spine:  1. Normal MRI appearance of the cervical spinal cord. 2. Very minimal degenerative uncovertebral hypertrophy at C4-5 and C5-6. No significant stenosis identified within the cervical spine. Review of Systems: Patient complains of symptoms per HPI as well as the following symptoms: Numbness, back pain, aching muscles, neck pain, daytime sleepiness. Pertinent negatives per HPI. All others negative.   History   Social History  . Marital Status: Married    Spouse Name: Minerva Areola  . Number of Children: 1  . Years of Education: MSN   Occupational History  . Registered Nurse [employer]   Social History Main Topics  . Smoking status: Never Smoker   . Smokeless tobacco: Never Used  . Alcohol Use: No     Comment: Quit in 2014  . Drug Use: No  . Sexual Activity: Yes   Other Topics Concern  . Not on file   Social History Narrative   Lives at  home with husband and son.   Caffeine use: 4 cups coffee per week.    Family History  Problem Relation Age of Onset  . Hypertension Mother   . Bell's palsy  Mother   . Sarcoidosis Mother   . Diabetes type II Mother   . Migraines Neg Hx     Past Medical History  Diagnosis Date  . Normal pregnancy in third trimester 12/25/2013  . Asthma   . Anxiety   . Depression     post partum  . Post partum depression   . Encounter for sterilization 09/19/2014    Past Surgical History  Procedure Laterality Date  . Tonsillectomy    . Tympanoplasty Left 1993    Current Outpatient Prescriptions  Medication Sig Dispense Refill  . albuterol (PROVENTIL HFA;VENTOLIN HFA) 108 (90 BASE) MCG/ACT inhaler Inhale 1-2 puffs into the lungs every 6 (six) hours as needed for wheezing or shortness of breath.    . gabapentin (NEURONTIN) 300 MG capsule Take 1 capsule (300 mg total) by mouth 3 (three) times daily. 90 capsule 0  . ibuprofen (ADVIL,MOTRIN) 600 MG tablet Take 1 tablet (600 mg total) by mouth every 6 (six) hours. (Patient taking differently: Take 600 mg by mouth every 6 (six) hours as needed for mild pain or moderate pain. ) 30 tablet 0  . lamoTRIgine (LAMICTAL) 150 MG tablet Take 150 mg by mouth at bedtime.    . Levonorgestrel 13.5 MG IUD by Intrauterine route.    . predniSONE (DELTASONE) 10 MG tablet Take 10 mg by mouth daily with breakfast. 12-day taper dose pack.    . pregabalin (LYRICA) 50 MG capsule Take 1 capsule (50 mg total) by mouth 3 (three) times daily. 90 capsule 4  . propranolol ER (INDERAL LA) 80 MG 24 hr capsule Take 1 capsule (80 mg total) by mouth daily. 90 capsule 4   No current facility-administered medications for this visit.    Allergies as of 10/06/2014 - Review Complete 09/19/2014  Allergen Reaction Noted  . Sulfa antibiotics Nausea And Vomiting 12/25/2013    Vitals: BP 131/85 mmHg  Pulse 78  LMP 07/22/2014 (Approximate) Last Weight:  Wt Readings from Last 1 Encounters:  09/19/14 240 lb (108.863 kg)   Last Height:   Ht Readings from Last 1 Encounters:  09/19/14  (1.676 m)    Physical exam: Exam: Gen: NAD,  conversant, well nourised, well groomed                     CV: RRR, no MRG. No Carotid Bruits. No peripheral edema, warm, nontender Eyes: Conjunctivae clear without exudates or hemorrhage  Neuro: Detailed Neurologic Exam  Speech:    Speech is normal; fluent and spontaneous with normal comprehension.  Cognition:    The patient is oriented to person, place, and time;     recent and remote memory intact;     language fluent;     normal attention, concentration,     fund of knowledge Cranial Nerves:    The pupils are equal, round, and reactive to light. The fundi are normal and spontaneous venous pulsations are present. Visual fields are full to finger confrontation. Extraocular movements are intact. Trigeminal sensation is intact and the muscles of mastication are normal. The face is symmetric. The palate elevates in the midline. Hearing intact. Voice is normal. Shoulder shrug is normal. The tongue has normal motion without fasciculations.   Coordination:    Normal finger to nose and heel to  shin. Normal rapid alternating movements.   Gait:    Heel-toe and tandem gait are normal.   Motor Observation:    No asymmetry, no atrophy, and no involuntary movements noted. Tone:    Normal muscle tone.    Posture:    Posture is normal. normal erect    Strength:    Strength is V/V in the upper and lower limbs.      Assessment/Plan:   34 year old female with paresthesias in the face and arms. MRI with non-specific white matter changes. Differential includes persistent migraine aura vs sensory seizure vs causes of white matter change in the brain including demyelinating disease/inflammatory disease/vasculitis.  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 We'll continue Lyrica. Patient is on Lamictal for mood. We'll start Inderal which may help patient with her anxiety as well as migraine symptoms. Discussed that this is a beta blocker, blood pressure medication  and that some of the symptoms that she should watch out for his chest pain shortness of breath, dizziness. Stop for anything concerning  eeg for eval of sensory seizures was normal Emg/ncs to evaluate paresthesias in the left arm was normal Will order labwork was normal For abnormal white matter changes: Carotid dopplers, Echo, MRi w/wo contrast with MS protocol,  MRA of the head which were all normal Consider LP after workup - we'll hold off on this, unlikely that this is MS and don't think that lumbar puncture is needed at this time Daily baby aspirin for stroke prevention Leukocytosis - can be due to steroids, but wbcs also elevated 8 months ago. Neutrophil redominance. Will recheck.     Naomie Dean, MD  [facility] [address] [place], [place] [address]  Phone [phone] Fax [phone]  A total of 15 minutes was spent face-to-face with this patient. Over half this time was spent on counseling patient on the migraine aura without headacje diagnosis and different diagnostic and therapeutic options available.

## 2014-10-06 NOTE — Telephone Encounter (Signed)
Spoke with Ester from [employer] and she is going to fax over lab results patient previously had completed that have been in process. There was a system error according to Ileene Rubens from the IT department [phone]). Molly Maduro also gave me Lynwood Dawley number for contact about labs at [phone]. Ester is faxing over results to [phone] with attn to [facility].

## 2014-10-07 ENCOUNTER — Telehealth: Payer: Self-pay

## 2014-10-07 DIAGNOSIS — G43109 Migraine with aura, not intractable, without status migrainosus: Secondary | ICD-10-CM | POA: Insufficient documentation

## 2014-10-07 LAB — CBC
HEMATOCRIT: 37.8 % (ref 34.0–46.6)
Hemoglobin: 11.8 g/dL (ref 11.1–15.9)
MCH: 24.8 pg — AB (ref 26.6–33.0)
MCHC: 31.2 g/dL — AB (ref 31.5–35.7)
MCV: 80 fL (ref 79–97)
Platelets: 363 10*3/uL (ref 150–379)
RBC: 4.75 x10E6/uL (ref 3.77–5.28)
RDW: 15.7 % — ABNORMAL HIGH (ref 12.3–15.4)
WBC: 10.6 10*3/uL (ref 3.4–10.8)

## 2014-10-07 NOTE — Telephone Encounter (Signed)
Patient was informed that her WBC was within normal limits, she has no further questions at this time

## 2014-12-11 ENCOUNTER — Telehealth: Payer: Self-pay | Admitting: Neurology

## 2014-12-11 ENCOUNTER — Telehealth: Payer: Self-pay | Admitting: *Deleted

## 2014-12-11 NOTE — Telephone Encounter (Signed)
Error

## 2014-12-11 NOTE — Telephone Encounter (Signed)
Left VM returning pt call. Gave [facility] phone number and told her we are open until 5pm.

## 2014-12-11 NOTE — Telephone Encounter (Signed)
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 She is not sure if the heat is related but her headaches have gotten worse. She previously tried Neurontin and is wondering if she should switch back and see if this helps compared to Lyrica now. She drinks 5 liters of water/ day. Sleeping okay--wakes up at night in the middle of the night occasionally, but otherwise she sleeps okay. 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 This is not new. I told her I would ask the work-in doctor, Dr. Pearlean Brownie and would call her back because Dr. Lucia Gaskins is out of the office until Monday. She verbalized understanding.

## 2014-12-11 NOTE — Telephone Encounter (Signed)
I spoke to the patient. She was taking Lyrica only 50 mg once daily. I advised her to increase it to twice daily for 3 days and if needed 3 times daily after that and call us back in 1 week.

## 2014-12-11 NOTE — Telephone Encounter (Signed)
Patient called and requested to speak with Marlis Edelson. She has some questions about her medication. Please call and advise.

## 2014-12-11 NOTE — Telephone Encounter (Signed)
Patient returned call. Please call and advise.  °

## 2014-12-12 NOTE — Telephone Encounter (Signed)
Thank you :)

## 2014-12-24 ENCOUNTER — Telehealth: Payer: Self-pay | Admitting: Neurology

## 2014-12-24 DIAGNOSIS — R202 Paresthesia of skin: Secondary | ICD-10-CM

## 2014-12-24 DIAGNOSIS — G43109 Migraine with aura, not intractable, without status migrainosus: Secondary | ICD-10-CM

## 2014-12-24 MED ORDER — PREGABALIN 50 MG PO CAPS: 50.0000 mg | ORAL_CAPSULE | Freq: Three times a day (TID) | ORAL | Status: DC

## 2014-12-24 NOTE — Telephone Encounter (Signed)
Rx has been sent.  I called back.  Got no answer.  Left message.  

## 2014-12-24 NOTE — Telephone Encounter (Signed)
Pt called states that the Lyrica 150mg  is working great. She would like a rx . 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  [facility] phone number [phone]. May call pt at [phone]

## 2014-12-25 NOTE — Telephone Encounter (Signed)
Pt called Pharmacy is telling her they don't have the prescription for Lyrica that we sent in

## 2014-12-25 NOTE — Telephone Encounter (Signed)
I called the pharmacy.  Spoke with Weyman Croon.  He verified they do have the Rx.  Says they have a traveling pharmacist filling in, so they were behind and had not processed yesterdays orders yet.  They will take care of this today.  I called the patient back to advise.  Got no answer.  Left message.

## 2014-12-26 ENCOUNTER — Other Ambulatory Visit: Payer: Self-pay

## 2014-12-26 MED ORDER — GABAPENTIN 300 MG PO CAPS: 300.0000 mg | ORAL_CAPSULE | Freq: Three times a day (TID) | ORAL | Status: DC

## 2014-12-26 NOTE — Telephone Encounter (Signed)
Patient called stating Lyrica will cost her almost $600/month. She is requesting to be switched back to Neurontin called in instead. She is out Neurontin. Please call and advise.

## 2014-12-26 NOTE — Telephone Encounter (Signed)
I spoke with Dr Lucia Gaskins, who was agreeable to changing the patient back to Gabapentin  TID.  Rx has ben sent to pharm in [place] per patient request.  Patient is aware.

## 2015-04-13 ENCOUNTER — Ambulatory Visit: Payer: 59 | Admitting: Neurology

## 2015-05-19 ENCOUNTER — Telehealth: Payer: Self-pay | Admitting: Neurology

## 2015-05-19 MED ORDER — GABAPENTIN 300 MG PO CAPS: 300.0000 mg | ORAL_CAPSULE | Freq: Three times a day (TID) | ORAL | Status: DC

## 2015-05-19 NOTE — Telephone Encounter (Signed)
Patient is calling to get Rx Gabapentin 300 mg refilled. She has a new pharmacy which is [facility] [address], [place], [place].  Phone [phone]. Thanks!

## 2016-11-28 ENCOUNTER — Ambulatory Visit: Payer: 59 | Admitting: Neurology

## 2016-12-14 ENCOUNTER — Ambulatory Visit (INDEPENDENT_AMBULATORY_CARE_PROVIDER_SITE_OTHER): Payer: BC Managed Care – PPO | Admitting: Neurology

## 2016-12-14 ENCOUNTER — Encounter: Payer: Self-pay | Admitting: Neurology

## 2016-12-14 DIAGNOSIS — G43109 Migraine with aura, not intractable, without status migrainosus: Secondary | ICD-10-CM | POA: Diagnosis not present

## 2016-12-14 DIAGNOSIS — R202 Paresthesia of skin: Secondary | ICD-10-CM

## 2016-12-14 MED ORDER — GABAPENTIN 300 MG PO CAPS: 300.0000 mg | ORAL_CAPSULE | Freq: Three times a day (TID) | ORAL | 5 refills | Status: DC

## 2016-12-14 MED ORDER — PROPRANOLOL HCL ER 80 MG PO CP24: 80.0000 mg | ORAL_CAPSULE | Freq: Every day | ORAL | 4 refills | Status: DC

## 2016-12-14 NOTE — Patient Instructions (Signed)
Remember to drink plenty of fluid, eat healthy meals and do not skip any meals. Try to eat protein with a every meal and eat a healthy snack such as fruit or nuts in between meals. Try to keep a regular sleep-wake schedule and try to exercise daily, particularly in the form of walking, 20-30 minutes a day, if you can.   As far as your medications are concerned, I would like to suggest: Continue current medications  I would like to see you back in 1 year, sooner if we need to. Please call us with any interim questions, concerns, problems, updates or refill requests.   Our phone number is [phone]. We also have an after hours call service for urgent matters and there is a physician on-call for urgent questions. For any emergencies you know to call 911 or go to the nearest emergency room   

## 2016-12-14 NOTE — Progress Notes (Signed)
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 She has been doing well until she stopped the Neurontin then the symptoms came back. Numbness in the left hand and in the left side of the face with the migraines. In a month, she has 10-15 headache days. 8 migraine days a month. She ran out of medications and now not taking them and migraines have worsened. She was taking Gabapentin twice daily which helped. She is also on propranolol and it helped. When on these medications her migraine frequency reduced. She works in the ED at [employer].   meds tried: Propranolol, Lamictal, Gabapentin, Topamax  Interval update 10/06/2014: She had a headache yesterday with her symptoms. Left sided with the facial numbness. Had light sensitivity, sound sensitivity, no nausea or vomiting. The symptoms continue as stated below. Discussed all the labs and procedures and images that were performed, extensive workup has been negative. Discussed that this is likely persistent migraine aura or part of migrainous process.   Extensive workup has been negative.  nml labs: hgba1c, sed, tsh, folate, b12, hiv,rpr,ace,lyme,pan-anca, ana,rf,cbc MR brain w/wo contrast showed no enhancing lesions MRA head was normal MR cervical spine unremarkable Emg/ncs nml Echo with bubble nml eeg nml Carotids nml  Initial visit 4/26: Olivia Rogers is a 36 y.o. female here as a referral from Dr. Freida Busman for paresthesias, facial paresthesias with confusion. Started a month ago when she had pain in the left shoulder, then progressed into the pectoral area nad neck, numbness and tingling. Then a week later had acute onset left-sided facial numbness then the right side of the face.She was confused/altered with the onset of the facial numbness. She was  sent home from work because she seemed confused. Prednisone and flexeril did not help with the shoulder and neck pain, cervical pillow did not help. Symptoms have been slowly progressive, worsening over the last month. Now having right side finger numbness mostly in the ulnar distribution. Majority of symptoms in the face and neck. 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 Had some weakness in left grip with Improvement after position. Mom with sarcoidosis. No FHx migraines. She had headaches as a child. She has had headaches behind the eyes, with migrainous symptoms of photophobia and nausea. No vision changes, recent illnesses, bowel or bladder changes.  Reviewed notes, labs and imaging from outside physicians, which showed: She was seen in the ED on 4/22 for paresthesias of the left face and left extremity. She was Dxed with cervical radiculopathy without improvement. Neurology was consulted, suspected migraine aura. Started on gabapentin. CMP unremarkable. CBC with elevated WBCs with neutrophil predominance.   Personally reviewed imaging and agree with below:  MRI head:  1. Scattered T2/FLAIR hyperintense foci involving the periventricular and deep white matter of both cerebral hemispheres. These foci are nonspecific. Differential considerations include underlying demyelinating disease, chronic small vessel ischemic changes, or possibly vasculitis. Foci such as these have also been described with migrainous disorders as well.  MRi cervical spine:  1. Normal MRI appearance of the cervical spinal cord. 2. Very minimal degenerative uncovertebral hypertrophy at C4-5 and C5-6. No significant stenosis identified within the cervical spine. Review of Systems: Patient complains of symptoms per HPI as well as the following symptoms: Numbness, back pain, aching muscles, neck pain, daytime  sleepiness. Pertinent negatives per HPI. All others  negative.    Social History   Social History  . Marital status: Married    Spouse name: Olivia Rogers  . Number of children: 1  . Years of education: MSN   Occupational History  . Registered Nurse [employer]   Social History Main Topics  . Smoking status: Never Smoker  . Smokeless tobacco: Never Used  . Alcohol use No     Comment: Quit in 2014  . Drug use: No  . Sexual activity: Yes   Other Topics Concern  . Not on file   Social History Narrative   Lives at home with husband and son.   Caffeine use: 4 cups coffee per week.    Family History  Problem Relation Age of Onset  . Hypertension Mother   . Bell's palsy Mother   . Sarcoidosis Mother   . Diabetes type II Mother   . Migraines Neg Hx     Past Medical History:  Diagnosis Date  . Anxiety   . Asthma   . Depression    post partum  . Encounter for sterilization 09/19/2014  . Normal pregnancy in third trimester 12/25/2013  . Post partum depression     Past Surgical History:  Procedure Laterality Date  . TONSILLECTOMY    . TYMPANOPLASTY Left 1993    Current Outpatient Prescriptions  Medication Sig Dispense Refill  . albuterol (PROVENTIL HFA;VENTOLIN HFA) 108 (90 BASE) MCG/ACT inhaler Inhale 1-2 puffs into the lungs every 6 (six) hours as needed for wheezing or shortness of breath.    . gabapentin (NEURONTIN) 300 MG capsule Take 1 capsule (300 mg total) by mouth 3 (three) times daily. 90 capsule 0  . lamoTRIgine (LAMICTAL) 200 MG tablet Take 200 mg by mouth.    . propranolol ER (INDERAL LA) 80 MG 24 hr capsule Take 1 capsule (80 mg total) by mouth daily. 90 capsule 4   No current facility-administered medications for this visit.     Allergies as of 12/14/2016 - Review Complete 12/14/2016  Allergen Reaction Noted  . Hydromorphone  05/14/2010  . Sulfa antibiotics Nausea And Vomiting 12/25/2013    Vitals: BP (!) 131/94   Pulse 98   Ht 5\' 5"  (1.651 m)   Wt 251 lb (113.9 kg)   BMI 41.77 kg/m  Last  Weight:  Wt Readings from Last 1 Encounters:  12/14/16 251 lb (113.9 kg)   Last Height:   Ht Readings from Last 1 Encounters:  12/14/16 5\' 5"  (1.651 m)        Assessment/Plan:   36 year old female with paresthesias in the face and arms. MRI with non-specific white matter changes. Differential includes persistent migraine aura vs sensory seizure vs causes of white matter change in the brain including demyelinating disease/inflammatory disease/vasculitis.  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 We'll continue Gabapentin and propranolol and refill meds. Patient is on Lamictal for mood.   eeg for eval of sensory seizures was normal Emg/ncs to evaluate paresthesias in the left arm was normal labwork was normal For abnormal white matter changes: Carotid dopplers, Echo, MRi w/wo contrast with MS protocol, MRA of the head which were all normal Discussed : To prevent or relieve headaches, try the following: Cool Compress. Lie down and place a cool compress on your head.  Avoid headache triggers. If certain foods or odors seem to have triggered your migraines in the past, avoid them. A headache diary might  help you identify triggers.  Include physical activity in your daily routine. Try a daily walk or other moderate aerobic exercise.  Manage stress. Find healthy ways to cope with the stressors, such as delegating tasks on your to-do list.  Practice relaxation techniques. Try deep breathing, yoga, massage and visualization.  Eat regularly. Eating regularly scheduled meals and maintaining a healthy diet might help prevent headaches. Also, drink plenty of fluids.  Follow a regular sleep schedule. Sleep deprivation might contribute to headaches Consider biofeedback. With this mind-body technique, you learn to control certain bodily functions - such as muscle tension, heart rate and blood pressure - to prevent headaches or reduce headache pain.    Proceed to  emergency room if you experience new or worsening symptoms or symptoms do not resolve, if you have new neurologic symptoms or if headache is severe, or for any concerning symptom.   Provided education and documentation from American headache Society toolbox including articles on: chronic migraine medication overuse headache, chronic migraines, prevention of migraines, behavioral and other nonpharmacologic treatments for headache. Naomie Dean, MD  [facility] [address] [place], [place] [address]  Phone [phone] Fax [phone]  A total of 15 minutes was spent face-to-face with this patient. Over half this time was spent on counseling patient on the migraine diagnosis and different diagnostic and therapeutic options available.

## 2016-12-17 DIAGNOSIS — G43109 Migraine with aura, not intractable, without status migrainosus: Secondary | ICD-10-CM | POA: Insufficient documentation

## 2017-01-20 ENCOUNTER — Telehealth: Payer: Self-pay | Admitting: Neurology

## 2017-01-20 NOTE — Telephone Encounter (Signed)
Patient reporting new symptoms, given white matter changes should repeat MRI brain.

## 2017-01-23 ENCOUNTER — Other Ambulatory Visit: Payer: Self-pay | Admitting: Neurology

## 2017-01-23 DIAGNOSIS — G35 Multiple sclerosis: Secondary | ICD-10-CM

## 2017-01-23 DIAGNOSIS — R29818 Other symptoms and signs involving the nervous system: Secondary | ICD-10-CM

## 2017-01-23 DIAGNOSIS — R202 Paresthesia of skin: Secondary | ICD-10-CM

## 2017-01-23 NOTE — Progress Notes (Signed)
Patient reporting new event, tingling in the face and arm, worsened headache and vision changes. MRI in 2016 with white matter changes supsicious for MS need repeat and possibly LP.

## 2017-01-29 ENCOUNTER — Telehealth: Payer: Self-pay | Admitting: Neurology

## 2017-01-29 NOTE — Telephone Encounter (Signed)
Olivia Rogers, patient contacted me and said no one has called about imaging ordered 01/23/2017. I told her it takes a week to 10 days sometimes to hear, but just to make sure would you look into it? thanks

## 2017-01-30 NOTE — Telephone Encounter (Signed)
Noted, she is scheduled for 02/08/17 at the [facility] mobile unit.

## 2017-02-08 ENCOUNTER — Ambulatory Visit (INDEPENDENT_AMBULATORY_CARE_PROVIDER_SITE_OTHER): Payer: BC Managed Care – PPO

## 2017-02-14 ENCOUNTER — Other Ambulatory Visit: Payer: BC Managed Care – PPO

## 2017-02-21 ENCOUNTER — Ambulatory Visit (INDEPENDENT_AMBULATORY_CARE_PROVIDER_SITE_OTHER): Payer: BC Managed Care – PPO

## 2017-02-21 DIAGNOSIS — R29818 Other symptoms and signs involving the nervous system: Secondary | ICD-10-CM | POA: Diagnosis not present

## 2017-02-21 DIAGNOSIS — R202 Paresthesia of skin: Secondary | ICD-10-CM

## 2017-02-21 DIAGNOSIS — G35 Multiple sclerosis: Secondary | ICD-10-CM

## 2017-02-21 MED ORDER — GADOPENTETATE DIMEGLUMINE 469.01 MG/ML IV SOLN: 20.0000 mL | Freq: Once | INTRAVENOUS | Status: AC | PRN

## 2017-02-24 ENCOUNTER — Telehealth: Payer: Self-pay | Admitting: Neurology

## 2017-02-24 NOTE — Telephone Encounter (Signed)
MRi of the head and neck were stable, great news.  I recommend a lumbar puncture at this point. Would you see if she agrees? Thanks!

## 2017-02-27 ENCOUNTER — Other Ambulatory Visit: Payer: Self-pay | Admitting: Neurology

## 2017-02-27 DIAGNOSIS — G35 Multiple sclerosis: Secondary | ICD-10-CM

## 2017-02-27 NOTE — Telephone Encounter (Signed)
I spoke with patient and made her aware that MRI head and neck are stable and patient would like to go ahead with Lumbar puncture. Would you like to add labs to it?

## 2017-03-20 ENCOUNTER — Ambulatory Visit
Admission: RE | Admit: 2017-03-20 | Discharge: 2017-03-20 | Disposition: A | Discharge status: Home / Self Care | Payer: BC Managed Care – PPO | Source: Ambulatory Visit | Attending: Neurology | Admitting: Neurology

## 2017-03-20 VITALS — BP 141/77 | HR 67

## 2017-03-20 DIAGNOSIS — R9082 White matter disease, unspecified: Secondary | ICD-10-CM

## 2017-03-20 DIAGNOSIS — R202 Paresthesia of skin: Secondary | ICD-10-CM

## 2017-03-20 DIAGNOSIS — G35 Multiple sclerosis: Secondary | ICD-10-CM

## 2017-03-20 DIAGNOSIS — R29898 Other symptoms and signs involving the musculoskeletal system: Secondary | ICD-10-CM

## 2017-03-20 DIAGNOSIS — R200 Anesthesia of skin: Secondary | ICD-10-CM

## 2017-03-20 NOTE — Discharge Instructions (Signed)

## 2017-03-21 ENCOUNTER — Telehealth: Payer: Self-pay | Admitting: *Deleted

## 2017-03-21 NOTE — Telephone Encounter (Signed)
Called patient. She verbalized understanding that labs are normal so far but it will take up to a week or 10 days before the MS specific labs come back. She had no questions.

## 2017-03-21 NOTE — Telephone Encounter (Signed)
-----   Message from Anson Fret, MD sent at 03/21/2017  1:34 PM EDT ----- Labs normal so far but it will take up to a week or 10 days to get the MS-specific labs back thanks

## 2017-03-22 ENCOUNTER — Telehealth: Payer: Self-pay | Admitting: Neurology

## 2017-03-22 DIAGNOSIS — G971 Other reaction to spinal and lumbar puncture: Secondary | ICD-10-CM

## 2017-03-22 NOTE — Telephone Encounter (Signed)
Order placed for the blood patch at [facility].

## 2017-03-22 NOTE — Telephone Encounter (Signed)
Called patient, she reports that she followed instructions and put herself on bedrest for 24 hours after LP, then continued bedrest another 24 hours due to spinal headache. She states she can only sit up 5-10 minutes at a time, cannot bend over, lying flat decreases the pain. She denies any other neurological symptoms/changes. I advised her to continue bedrest for another 24 hours and drink caffeine/coffee. I also informed her I would d/w Dr. Lucia GaskinsAhern in case she wanted to order a blood patch.

## 2017-03-22 NOTE — Telephone Encounter (Signed)
Called patient and she agrees to the blood patch. She is hopeful they can do it tomorrow or Friday.

## 2017-03-22 NOTE — Addendum Note (Signed)
Addended by: Bertram Savin on: 03/22/2017 01:28 PM   Modules accepted: Orders

## 2017-03-22 NOTE — Telephone Encounter (Signed)
I would recommend ordering a blood patch because the weekend is coming and if we wait she may not be able to get one until next week. If she agrees place order and call [facility] and get her an appointment for tomorrow or Friday if they can. If she feels better she can cancel it thanks. thanks

## 2017-03-22 NOTE — Telephone Encounter (Signed)
Pt calling re: Lumbar Puncture that she just had, pt states she has had a bad head ache since.  She states she has followed the directions to the T.  Pt states she was told that Dr Lucia Gaskins would need to give orders for a Blood Patch, please call pt

## 2017-03-23 ENCOUNTER — Encounter: Payer: Self-pay | Admitting: Neurology

## 2017-03-23 ENCOUNTER — Other Ambulatory Visit: Payer: Self-pay | Admitting: Neurology

## 2017-03-23 ENCOUNTER — Ambulatory Visit
Admission: RE | Admit: 2017-03-23 | Discharge: 2017-03-23 | Disposition: A | Discharge status: Home / Self Care | Payer: BC Managed Care – PPO | Source: Ambulatory Visit | Attending: Neurology | Admitting: Neurology

## 2017-03-23 DIAGNOSIS — G971 Other reaction to spinal and lumbar puncture: Secondary | ICD-10-CM

## 2017-03-23 MED ORDER — IOPAMIDOL (ISOVUE-M 200) INJECTION 41%
1.0000 mL | Freq: Once | INTRAMUSCULAR | Status: AC
  Administered 2017-03-23: 1 mL via EPIDURAL

## 2017-03-23 NOTE — Progress Notes (Signed)
20cc blood drawn from right AC space for epidural blood patch; site unremarkable. 

## 2017-03-23 NOTE — Discharge Instructions (Signed)

## 2017-03-27 ENCOUNTER — Telehealth: Payer: Self-pay | Admitting: *Deleted

## 2017-03-27 NOTE — Telephone Encounter (Signed)
Called and LVM (ok on DPR) saying that labs look normal so far and we are still awaiting one lab. I told her this message does not require a call back but left the office number in case she has any questions.

## 2017-03-27 NOTE — Telephone Encounter (Signed)
-----   Message from Anson FretAntonia B Ahern, MD sent at 03/26/2017 12:15 PM EST ----- Labs so far look normal, still awaiting one lab thanks

## 2017-03-28 NOTE — Telephone Encounter (Signed)
Called and LVM (ok per DPR) with lab result update from Dr. Lucia GaskinsAhern: All labs negative and look great, negative for multiple sclerosis. I told her that this message does not require a call back but if she has any questions, she can call our office. I left the office number.

## 2017-03-29 LAB — CNS IGG SYNTHESIS RATE, CSF+BLOOD
Albumin Serum: 4.2 g/dL (ref 3.5–5.2)
Albumin, CSF: 14.4 mg/dL (ref 8.0–42.0)
CNS-IgG Synthesis Rate: -4 mg/24 h (ref <=3.3)
IgG (Immunoglobin G), Serum: 1080 mg/dL (ref 694–1618)
IgG Total CSF: 1.7 mg/dL (ref 0.8–7.7)
IgG-Index: 0.46 (ref <0.66)

## 2017-03-29 LAB — GLUCOSE, CSF: Glucose, CSF: 67 mg/dL (ref 40–80)

## 2017-03-29 LAB — VDRL, CSF: SYPHILIS VDRL QUANT CSF: NONREACTIVE

## 2017-03-29 LAB — BORRELIA SPECIES DNA, FLUID, PCR: B. burgdorferi DNA: NOT DETECTED

## 2017-03-29 LAB — CSF CELL COUNT WITH DIFFERENTIAL
RBC COUNT CSF: 0 {cells}/uL (ref 0–10)
WBC CSF: 0 {cells}/uL (ref 0–5)

## 2017-03-29 LAB — PROTEIN, CSF: Total Protein, CSF: 27 mg/dL (ref 15–45)

## 2017-03-29 LAB — OLIGOCLONAL BANDS, CSF + SERM

## 2017-03-30 ENCOUNTER — Telehealth: Payer: Self-pay | Admitting: *Deleted

## 2017-03-30 NOTE — Telephone Encounter (Signed)
Called and spoke with pt. She verbalized understanding of additional lab results showing everything normal still not indicating MS. She had no questions.

## 2017-03-30 NOTE — Telephone Encounter (Signed)
-----   Message from Anson Fret, MD sent at 03/30/2017  8:06 AM EST ----- everythign normal, no suggestion of MS thanks

## 2017-10-08 ENCOUNTER — Encounter: Payer: Self-pay | Admitting: Neurology

## 2017-10-09 ENCOUNTER — Telehealth: Payer: Self-pay | Admitting: Neurology

## 2017-10-09 NOTE — Telephone Encounter (Signed)
Patient is returning your call to schedule first Botox injection.

## 2017-10-09 NOTE — Telephone Encounter (Signed)
I called the patient to schedule, she did not answer so I left a  VM asking her to call me back.  °

## 2017-10-09 NOTE — Telephone Encounter (Signed)
Patient returning your call to schedule 1st Botox injection with Dr. Lucia Gaskins.

## 2017-10-10 NOTE — Telephone Encounter (Signed)
I called and scheduled the patient for apt.  °

## 2017-10-31 ENCOUNTER — Encounter: Payer: Self-pay | Admitting: Neurology

## 2017-11-02 ENCOUNTER — Telehealth: Payer: Self-pay | Admitting: Neurology

## 2017-11-02 NOTE — Telephone Encounter (Signed)
I can see Olivia Rogers on June 26th or 27th  We will have to create a 430 or 5pm spot as I do not want to double book. [name], can we make this happen? Toma Copier can create the slot after [name] calls her?. Does this give you enough time to get her botox approved?

## 2017-11-06 NOTE — Telephone Encounter (Signed)
Paper work for [employer] and [employer] have been signed by the work in Dr. (Dr. Anne Hahn) and faxed over.

## 2017-11-07 ENCOUNTER — Telehealth: Payer: Self-pay | Admitting: Neurology

## 2017-11-07 ENCOUNTER — Encounter: Payer: Self-pay | Admitting: Neurology

## 2017-11-07 NOTE — Telephone Encounter (Signed)
I called the patient and informed her that she has been submitted to [facility] and we are waiting on approvals. I told her I would call her back when I hear back from [facility].

## 2017-11-07 NOTE — Telephone Encounter (Signed)
I called the patient and informed her that she has been submitted to [employer] and we are waiting on approvals. I told her I would call her back when I hear back from [employer].  °

## 2017-11-07 NOTE — Telephone Encounter (Signed)
Pt requesting a call to discuss upcoming botox appt.

## 2017-11-08 NOTE — Telephone Encounter (Signed)
[facility] approved [id number] (05/10/18)

## 2017-11-08 NOTE — Telephone Encounter (Signed)
I called the patient to make her aware everything was approved and she needed to go ahead and do the enrollment at the pharmacy. She did not answer so I left her a VM asking her to call back. If she calls back please have her call [facility] at [phone].

## 2017-11-08 NOTE — Telephone Encounter (Signed)
[employer] auth; approved [id number]  [id number] °(05/05/18)  ° °

## 2017-11-08 NOTE — Telephone Encounter (Signed)
The patient called back and requested to speak with me. I informed her that I did not have access to work her in on the 26th or 27th and she would have to wait until Dr. Lucia Gaskins and Toma Copier arrived on Monday bit she could plan on coming in on one of those days.

## 2017-11-08 NOTE — Telephone Encounter (Signed)
[employer] [place]; approved [id number]  [phone] (05/05/18)

## 2017-11-09 ENCOUNTER — Encounter: Payer: Self-pay | Admitting: Neurology

## 2017-11-09 NOTE — Telephone Encounter (Signed)
Sandra/ [facility] [phone] called, botox delivery is set for 7/2

## 2017-11-09 NOTE — Telephone Encounter (Signed)
I called and changed delivery to 06/25.

## 2017-11-13 NOTE — Telephone Encounter (Signed)
Noted, thank you

## 2017-11-13 NOTE — Telephone Encounter (Signed)
Called pt and scheduled her for this Wed 6/26 @ 4:30 pm arrival time 4-4:15. She verbalized appreciation and understanding. Also canceled the July appt since pt seen earlier. Pt aware.

## 2017-11-15 ENCOUNTER — Ambulatory Visit: Payer: BC Managed Care – PPO | Admitting: Neurology

## 2017-11-15 ENCOUNTER — Encounter: Payer: Self-pay | Admitting: Neurology

## 2017-11-15 DIAGNOSIS — G43711 Chronic migraine without aura, intractable, with status migrainosus: Secondary | ICD-10-CM | POA: Diagnosis not present

## 2017-11-15 NOTE — Progress Notes (Signed)
This is her first botox. +eyes, +masseters, + temples, + levator scapulae    Consent Form Botulism Toxin Injection For Chronic Migraine    Reviewed orally with patient, additionally signature is on file:  Botulism toxin has been approved by the Federal drug administration for treatment of chronic migraine. Botulism toxin does not cure chronic migraine and it may not be effective in some patients.  The administration of botulism toxin is accomplished by injecting a small amount of toxin into the muscles of the neck and head. Dosage must be titrated for each individual. Any benefits resulting from botulism toxin tend to wear off after 3 months with a repeat injection required if benefit is to be maintained. Injections are usually done every 3-4 months with maximum effect peak achieved by about 2 or 3 weeks. Botulism toxin is expensive and you should be sure of what costs you will incur resulting from the injection.  The side effects of botulism toxin use for chronic migraine may include:   -Transient, and usually mild, facial weakness with facial injections  -Transient, and usually mild, head or neck weakness with head/neck injections  -Reduction or loss of forehead facial animation due to forehead muscle weakness  -Eyelid drooping  -Dry eye  -Pain at the site of injection or bruising at the site of injection  -Double vision  -Potential unknown long term risks  Contraindications: You should not have Botox if you are pregnant, nursing, allergic to albumin, have an infection, skin condition, or muscle weakness at the site of the injection, or have myasthenia gravis, Lambert-Eaton syndrome, or ALS.  It is also possible that as with any injection, there may be an allergic reaction or no effect from the medication. Reduced effectiveness after repeated injections is sometimes seen and rarely infection at the injection site may occur. All care will be taken to prevent these side effects. If  therapy is given over a long time, atrophy and wasting in the muscle injected may occur. Occasionally the patient's become refractory to treatment because they develop antibodies to the toxin. In this event, therapy needs to be modified.  I have read the above information and consent to the administration of botulism toxin.    BOTOX PROCEDURE NOTE FOR MIGRAINE HEADACHE    Contraindications and precautions discussed with patient(above). Aseptic procedure was observed and patient tolerated procedure. Procedure performed by Dr. Artemio Aly  The condition has existed for more than 6 months, and pt does not have a diagnosis of ALS, Myasthenia Gravis or Lambert-Eaton Syndrome.  Risks and benefits of injections discussed and pt agrees to proceed with the procedure.  Written consent obtained  These injections are medically necessary. Pt  receives good benefits from these injections. These injections do not cause sedations or hallucinations which the oral therapies may cause.  Indication/Diagnosis: chronic migraine BOTOX(J0585) injection was performed according to protocol by [employer]. 200 units of BOTOX was dissolved into 4 cc NS.   NDC: [address]-40   Description of procedure:  The patient was placed in a sitting position. The standard protocol was used for Botox as follows, with 5 units of Botox injected at each site:   -Procerus muscle, midline injection  -Corrugator muscle, bilateral injection  -Frontalis muscle, bilateral injection, with 2 sites each side, medial injection was performed in the upper one third of the frontalis muscle, in the region vertical from the medial inferior edge of the superior orbital rim. The lateral injection was again in the upper one third of the forehead  vertically above the lateral limbus of the cornea, 1.5 cm lateral to the medial injection site.  -Temporalis muscle injection, 4 sites, bilaterally. The first injection was 3 cm above the tragus of the ear,  second injection site was 1.5 cm to 3 cm up from the first injection site in line with the tragus of the ear. The third injection site was 1.5-3 cm forward between the first 2 injection sites. The fourth injection site was 1.5 cm posterior to the second injection site.  -Occipitalis muscle injection, 3 sites, bilaterally. The first injection was done one half way between the occipital protuberance and the tip of the mastoid process behind the ear. The second injection site was done lateral and superior to the first, 1 fingerbreadth from the first injection. The third injection site was 1 fingerbreadth superiorly and medially from the first injection site.  -Cervical paraspinal muscle injection, 2 sites, bilateral knee first injection site was 1 cm from the midline of the cervical spine, 3 cm inferior to the lower border of the occipital protuberance. The second injection site was 1.5 cm superiorly and laterally to the first injection site.  -Trapezius muscle injection was performed at 3 sites, bilaterally. The first injection site was in the upper trapezius muscle halfway between the inflection point of the neck, and the acromion. The second injection site was one half way between the acromion and the first injection site. The third injection was done between the first injection site and the inflection point of the neck.   Will return for repeat injection in 3 months.   A 200 unit sof Botox was used, 155 units were injected, the rest of the Botox was wasted. The patient tolerated the procedure well, there were no complications of the above procedure.

## 2017-11-15 NOTE — Progress Notes (Signed)
Botox- 100 units x 2 vials Lot: [id number] Expiration: 03/2020 NDC: 0023-1145-01  Bacteriostatic 0.9% Sodium Chloride- 4mL total Lot: [id number] Expiration: 09/21/2018 NDC: 0409-1966-02  Dx: G43.711 S/P  

## 2017-12-06 ENCOUNTER — Encounter

## 2017-12-06 ENCOUNTER — Ambulatory Visit: Payer: BC Managed Care – PPO | Admitting: Neurology

## 2018-01-11 ENCOUNTER — Other Ambulatory Visit: Payer: Self-pay | Admitting: Neurology

## 2018-01-27 ENCOUNTER — Other Ambulatory Visit: Payer: Self-pay | Admitting: Neurology

## 2018-01-27 DIAGNOSIS — G43109 Migraine with aura, not intractable, without status migrainosus: Secondary | ICD-10-CM

## 2018-01-27 DIAGNOSIS — R202 Paresthesia of skin: Secondary | ICD-10-CM

## 2018-02-05 ENCOUNTER — Other Ambulatory Visit: Payer: Self-pay | Admitting: Neurology

## 2018-02-19 ENCOUNTER — Ambulatory Visit: Payer: BC Managed Care – PPO | Admitting: Neurology

## 2018-02-19 DIAGNOSIS — G43711 Chronic migraine without aura, intractable, with status migrainosus: Secondary | ICD-10-CM

## 2018-02-19 DIAGNOSIS — M7918 Myalgia, other site: Secondary | ICD-10-CM

## 2018-02-19 NOTE — Progress Notes (Signed)
Botox- 100 units x 2 vials Lot: [id number] Expiration: 07/2020 NDC: 0023-1145-01  Bacteriostatic 0.9% Sodium Chloride- 4mL total Lot: [id number] Expiration: 02/21/2019 NDC: 0409-1966-02  Dx: G43.711 S/P 

## 2018-02-19 NOTE — Progress Notes (Signed)
This is her second botox. +eyes, +masseters, + temples, + levator scapulae.  exceptionsl improvement. >>50% decrease frequency. Was reduced to 3 headache days a month.     Consent Form Botulism Toxin Injection For Chronic Migraine    Reviewed orally with patient, additionally signature is on file:  Botulism toxin has been approved by the Federal drug administration for treatment of chronic migraine. Botulism toxin does not cure chronic migraine and it may not be effective in some patients.  The administration of botulism toxin is accomplished by injecting a small amount of toxin into the muscles of the neck and head. Dosage must be titrated for each individual. Any benefits resulting from botulism toxin tend to wear off after 3 months with a repeat injection required if benefit is to be maintained. Injections are usually done every 3-4 months with maximum effect peak achieved by about 2 or 3 weeks. Botulism toxin is expensive and you should be sure of what costs you will incur resulting from the injection.  The side effects of botulism toxin use for chronic migraine may include:   -Transient, and usually mild, facial weakness with facial injections  -Transient, and usually mild, head or neck weakness with head/neck injections  -Reduction or loss of forehead facial animation due to forehead muscle weakness  -Eyelid drooping  -Dry eye  -Pain at the site of injection or bruising at the site of injection  -Double vision  -Potential unknown long term risks  Contraindications: You should not have Botox if you are pregnant, nursing, allergic to albumin, have an infection, skin condition, or muscle weakness at the site of the injection, or have myasthenia gravis, Lambert-Eaton syndrome, or ALS.  It is also possible that as with any injection, there may be an allergic reaction or no effect from the medication. Reduced effectiveness after repeated injections is sometimes seen and rarely infection  at the injection site may occur. All care will be taken to prevent these side effects. If therapy is given over a long time, atrophy and wasting in the muscle injected may occur. Occasionally the patient's become refractory to treatment because they develop antibodies to the toxin. In this event, therapy needs to be modified.  I have read the above information and consent to the administration of botulism toxin.    BOTOX PROCEDURE NOTE FOR MIGRAINE HEADACHE    Contraindications and precautions discussed with patient(above). Aseptic procedure was observed and patient tolerated procedure. Procedure performed by Dr. Artemio Aly  The condition has existed for more than 6 months, and pt does not have a diagnosis of ALS, Myasthenia Gravis or Lambert-Eaton Syndrome.  Risks and benefits of injections discussed and pt agrees to proceed with the procedure.  Written consent obtained  These injections are medically necessary. Pt  receives good benefits from these injections. These injections do not cause sedations or hallucinations which the oral therapies may cause.  Indication/Diagnosis: chronic migraine BOTOX(J0585) injection was performed according to protocol by [employer]. 200 units of BOTOX was dissolved into 4 cc NS.   NDC: [address]-01   Description of procedure:  The patient was placed in a sitting position. The standard protocol was used for Botox as follows, with 5 units of Botox injected at each site:   -Procerus muscle, midline injection  -Corrugator muscle, bilateral injection  -Frontalis muscle, bilateral injection, with 2 sites each side, medial injection was performed in the upper one third of the frontalis muscle, in the region vertical from the medial inferior edge of the superior  orbital rim. The lateral injection was again in the upper one third of the forehead vertically above the lateral limbus of the cornea, 1.5 cm lateral to the medial injection site.  -Temporalis muscle  injection, 4 sites, bilaterally. The first injection was 3 cm above the tragus of the ear, second injection site was 1.5 cm to 3 cm up from the first injection site in line with the tragus of the ear. The third injection site was 1.5-3 cm forward between the first 2 injection sites. The fourth injection site was 1.5 cm posterior to the second injection site.  -Occipitalis muscle injection, 3 sites, bilaterally. The first injection was done one half way between the occipital protuberance and the tip of the mastoid process behind the ear. The second injection site was done lateral and superior to the first, 1 fingerbreadth from the first injection. The third injection site was 1 fingerbreadth superiorly and medially from the first injection site.  -Cervical paraspinal muscle injection, 2 sites, bilateral knee first injection site was 1 cm from the midline of the cervical spine, 3 cm inferior to the lower border of the occipital protuberance. The second injection site was 1.5 cm superiorly and laterally to the first injection site.  -Trapezius muscle injection was performed at 3 sites, bilaterally. The first injection site was in the upper trapezius muscle halfway between the inflection point of the neck, and the acromion. The second injection site was one half way between the acromion and the first injection site. The third injection was done between the first injection site and the inflection point of the neck.   Will return for repeat injection in 3 months.   A 200 unit sof Botox was used, 155 units were injected, the rest of the Botox was wasted. The patient tolerated the procedure well, there were no complications of the above procedure.

## 2018-03-21 IMAGING — XA DG FLUORO GUIDE LUMBAR PUNCTURE
1 series · 1 of 1 positions shown · non-contrast
Comparison: none

CLINICAL DATA: Areas of numbness including the face. Assess for
demyelinating disease.

[Series 1: ortho standard · 1 of 1 slices shown]
[im 1/1]
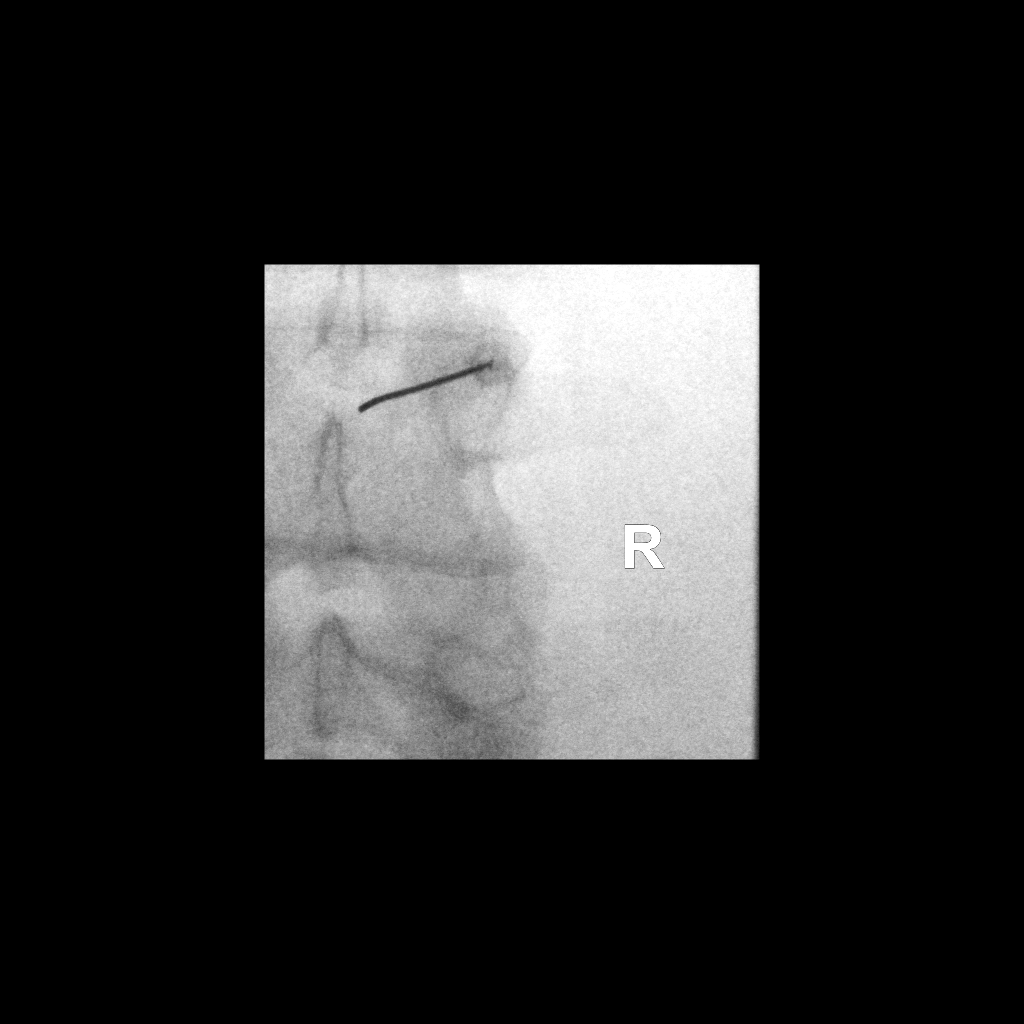

[1 of 1 positions shown; findings below may reference images not displayed]

EXAM:
DIAGNOSTIC LUMBAR PUNCTURE UNDER FLUOROSCOPIC GUIDANCE

FLUOROSCOPY TIME:  Fluoroscopy Time: 0 minutes 18 seconds.
micro gray meter squared

PROCEDURE:
Informed consent was obtained from the patient prior to the
procedure, including potential complications of headache, allergy,
and pain. With the patient prone, the lower back was prepped with
Betadine. 1% Lidocaine was used for local anesthesia. Lumbar
puncture was performed at the right L3-4 level using a 20 gauge
needle with return of clear CSF with an opening pressure of 17 cm
water. 9.5 ml of CSF were obtained for laboratory studies. The
patient tolerated the procedure well and there were no apparent
complications.
IMPRESSION: Lumbar puncture on the right at L3-4. Samples submitted for
requested studies.

## 2018-03-24 IMAGING — XA Imaging study
2 series · 2 of 2 positions shown · non-contrast
Comparison: none

CLINICAL DATA: Positional headaches following lumbar puncture 3
days ago.

[Series 2: ortho adipose · 1 of 1 slices shown (1 of 2)]
[im 1/1]
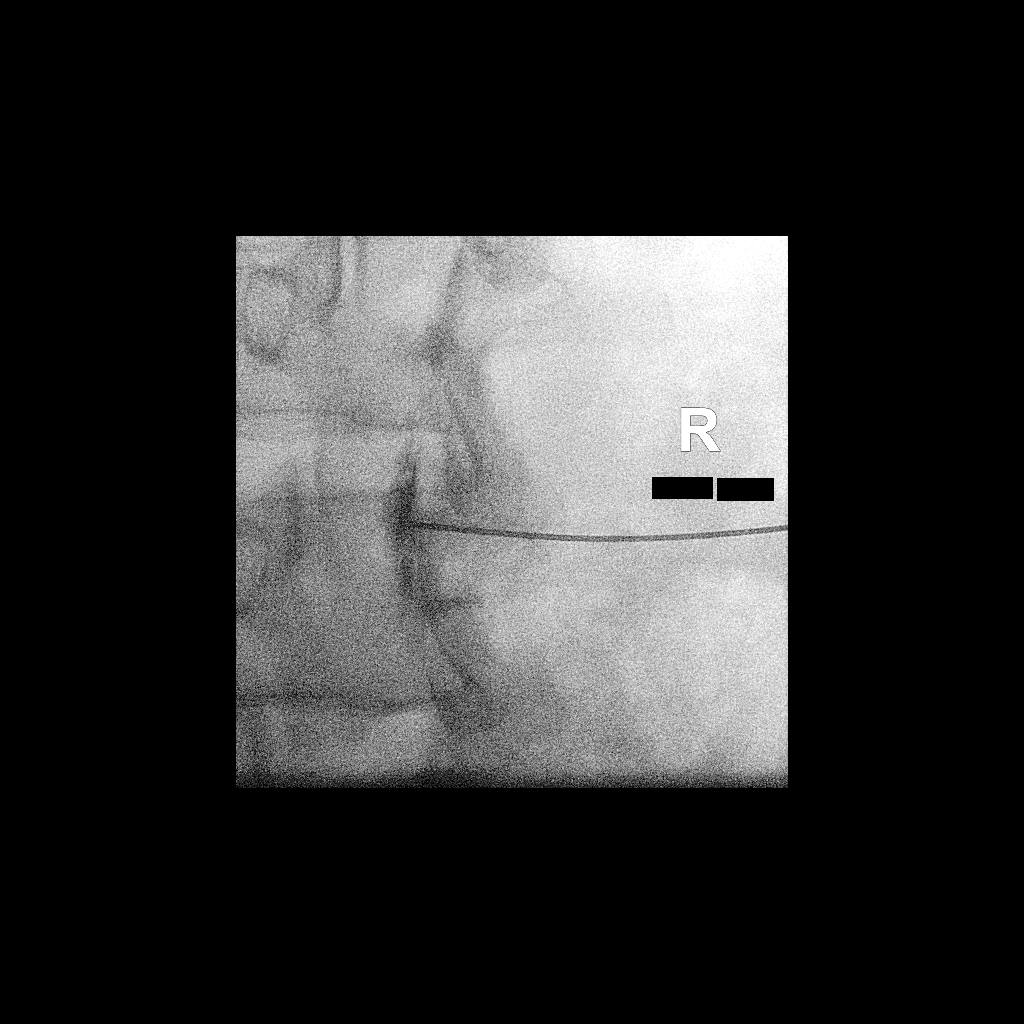

[Series 3: ortho adipose · 1 of 1 slices shown (2 of 2)]
[im 1/1]
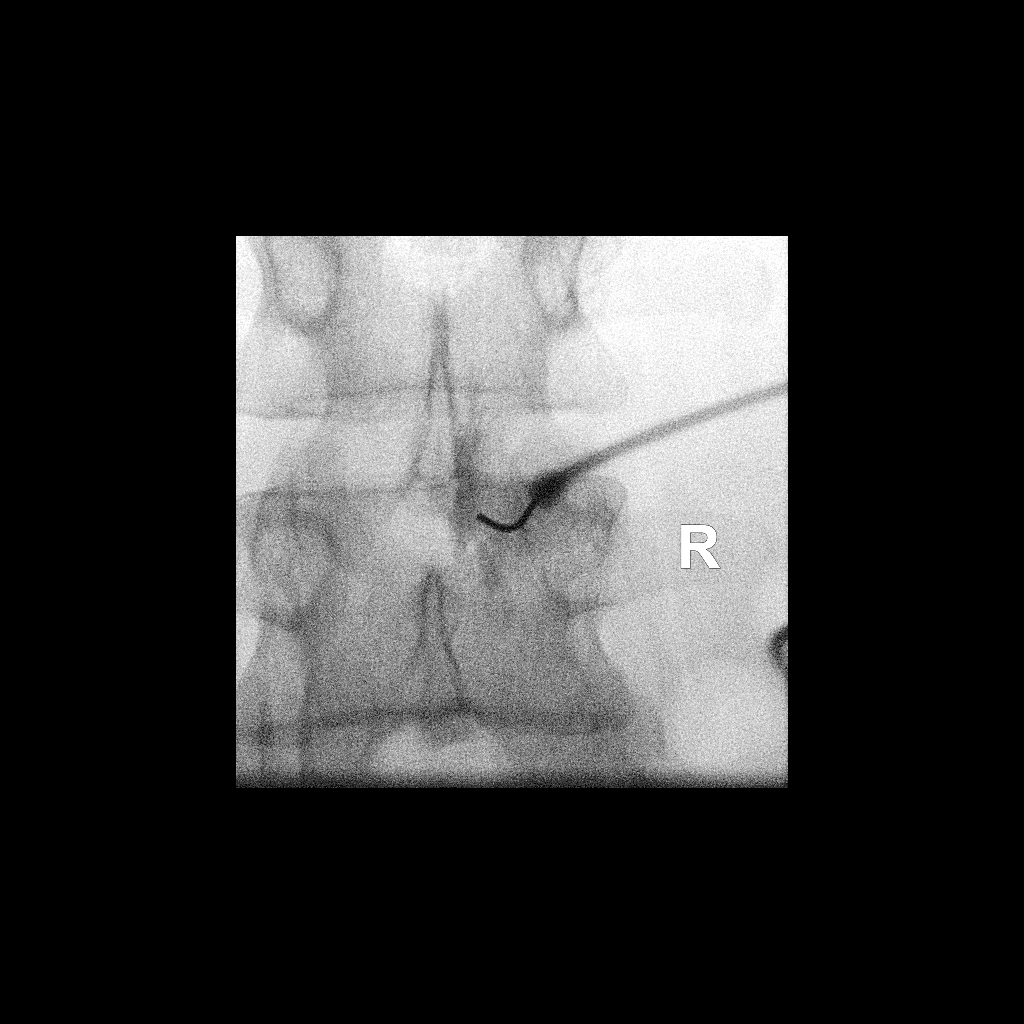

[2 of 2 positions shown; findings below may reference images not displayed]

EXAM:
DG INJECT/GUNGUI INC NEEDLE/BILLIOT/NYA EPI/IVARSDOTTIR/SAC W/IMG

PROCEDURE:
The procedure, risks, benefits, and alternatives were explained to
the patient. Questions regarding the procedure were encouraged and
answered. The patient understands and consents to the procedure.

LUMBAR EPIDURAL BLOOD PATCH: 15 ml of blood were withdrawn from the
patient's antecubital fossa. An epidural approach was taken on the
right at L3-4 using a 20 gauge epidural needle. Epidural positioning
was confirmed by injecting a small amount of 1.0ml Isovue-M 200.
There was no vascular communication. 15 ml of the patient's blood
was slowly injected into the epidural space in this location. The
procedure was well-tolerated and she was discharged in good
condition with instructions to lie down for additional day.
IMPRESSION: Lumbar epidural blood patch on the right Susulan3-4.

## 2018-03-29 ENCOUNTER — Telehealth: Payer: Self-pay | Admitting: *Deleted

## 2018-03-29 NOTE — Telephone Encounter (Signed)
PT plan of care forms signed and faxed back to [facility]. Received a receipt of confirmation.

## 2018-04-18 ENCOUNTER — Other Ambulatory Visit: Payer: Self-pay | Admitting: *Deleted

## 2018-04-18 MED ORDER — METHYLPREDNISOLONE 4 MG PO TBPK: ORAL_TABLET | ORAL | 0 refills | Status: DC

## 2018-04-18 NOTE — Progress Notes (Signed)
Medrol dose pack per v.o. Dr. Lucia Gaskins.

## 2018-04-23 ENCOUNTER — Other Ambulatory Visit: Payer: Self-pay | Admitting: Neurology

## 2018-04-23 ENCOUNTER — Other Ambulatory Visit: Payer: Self-pay

## 2018-04-23 ENCOUNTER — Other Ambulatory Visit: Payer: Self-pay | Admitting: *Deleted

## 2018-04-23 DIAGNOSIS — R9082 White matter disease, unspecified: Secondary | ICD-10-CM

## 2018-04-23 DIAGNOSIS — R200 Anesthesia of skin: Secondary | ICD-10-CM

## 2018-04-23 DIAGNOSIS — M5412 Radiculopathy, cervical region: Secondary | ICD-10-CM

## 2018-04-23 DIAGNOSIS — M542 Cervicalgia: Secondary | ICD-10-CM

## 2018-04-23 MED ORDER — METHYLPREDNISOLONE 4 MG PO TBPK: ORAL_TABLET | ORAL | 1 refills | Status: DC

## 2018-04-23 MED ORDER — GABAPENTIN 300 MG PO CAPS
ORAL_CAPSULE | ORAL | 1 refills | Status: DC
Start: 2018-04-23 — End: 2018-09-25

## 2018-04-25 ENCOUNTER — Telehealth: Payer: Self-pay | Admitting: Neurology

## 2018-04-25 NOTE — Telephone Encounter (Signed)
MR Brain w/wo contrast & MR Cervical spine wo contrast Dr. Valaria Good Auth: [id number] (exp. 04/25/18 to 05/24/18). Patient is scheduled at [facility] for 05/01/18

## 2018-04-30 ENCOUNTER — Other Ambulatory Visit: Payer: Self-pay | Admitting: Neurology

## 2018-04-30 DIAGNOSIS — R200 Anesthesia of skin: Secondary | ICD-10-CM

## 2018-04-30 NOTE — Progress Notes (Signed)
cbc

## 2018-05-01 ENCOUNTER — Ambulatory Visit: Payer: BC Managed Care – PPO

## 2018-05-01 ENCOUNTER — Other Ambulatory Visit (INDEPENDENT_AMBULATORY_CARE_PROVIDER_SITE_OTHER): Payer: Self-pay

## 2018-05-01 ENCOUNTER — Other Ambulatory Visit: Payer: Self-pay | Admitting: Neurology

## 2018-05-01 DIAGNOSIS — R9082 White matter disease, unspecified: Secondary | ICD-10-CM

## 2018-05-01 DIAGNOSIS — R200 Anesthesia of skin: Secondary | ICD-10-CM

## 2018-05-01 DIAGNOSIS — M5412 Radiculopathy, cervical region: Secondary | ICD-10-CM | POA: Diagnosis not present

## 2018-05-01 DIAGNOSIS — Z0289 Encounter for other administrative examinations: Secondary | ICD-10-CM

## 2018-05-01 DIAGNOSIS — M542 Cervicalgia: Secondary | ICD-10-CM

## 2018-05-02 LAB — CBC
HEMOGLOBIN: 12.7 g/dL (ref 11.1–15.9)
Hematocrit: 39.1 % (ref 34.0–46.6)
MCH: 26 pg — ABNORMAL LOW (ref 26.6–33.0)
MCHC: 32.5 g/dL (ref 31.5–35.7)
MCV: 80 fL (ref 79–97)
PLATELETS: 449 10*3/uL (ref 150–450)
RBC: 4.88 x10E6/uL (ref 3.77–5.28)
RDW: 14.5 % (ref 12.3–15.4)
WBC: 15.2 10*3/uL — ABNORMAL HIGH (ref 3.4–10.8)

## 2018-05-02 LAB — B. BURGDORFI ANTIBODIES: Lyme IgG/IgM Ab: <0.91 {ISR} (ref 0.00–0.90)

## 2018-05-02 LAB — COMPREHENSIVE METABOLIC PANEL
ALBUMIN: 4.2 g/dL (ref 3.5–5.5)
ALK PHOS: 84 IU/L (ref 39–117)
ALT: 18 IU/L (ref 0–32)
AST: 14 IU/L (ref 0–40)
Albumin/Globulin Ratio: 1.6 (ref 1.2–2.2)
BUN/Creatinine Ratio: 11 (ref 9–23)
BUN: 11 mg/dL (ref 6–20)
Bilirubin Total: 0.2 mg/dL (ref 0.0–1.2)
CHLORIDE: 98 mmol/L (ref 96–106)
CO2: 25 mmol/L (ref 20–29)
Calcium: 9.3 mg/dL (ref 8.7–10.2)
Creatinine, Ser: 0.96 mg/dL (ref 0.57–1.00)
GFR calc non Af Amer: 75 mL/min/{1.73_m2} (ref >59)
GFR, EST AFRICAN AMERICAN: 86 mL/min/{1.73_m2} (ref >59)
GLOBULIN, TOTAL: 2.6 g/dL (ref 1.5–4.5)
Glucose: 59 mg/dL — ABNORMAL LOW (ref 65–99)
Potassium: 4.5 mmol/L (ref 3.5–5.2)
Sodium: 140 mmol/L (ref 134–144)
Total Protein: 6.8 g/dL (ref 6.0–8.5)

## 2018-05-02 LAB — HEMOGLOBIN A1C
Est. average glucose Bld gHb Est-mCnc: 120 mg/dL
Hgb A1c MFr Bld: 5.8 % — ABNORMAL HIGH (ref 4.8–5.6)

## 2018-05-02 LAB — HIV ANTIBODY (ROUTINE TESTING W REFLEX): HIV Screen 4th Generation wRfx: NONREACTIVE

## 2018-05-03 ENCOUNTER — Other Ambulatory Visit: Payer: Self-pay | Admitting: Neurology

## 2018-05-08 ENCOUNTER — Telehealth: Payer: Self-pay | Admitting: Neurology

## 2018-05-08 NOTE — Telephone Encounter (Signed)
Olivia Rogers, Would you email patient and reschedule her botox to February please? Thank you!

## 2018-05-10 NOTE — Telephone Encounter (Signed)
Rescheduled apt and emailed patient.

## 2018-05-14 NOTE — Telephone Encounter (Signed)
Noted, thank you

## 2018-06-06 ENCOUNTER — Ambulatory Visit: Payer: BC Managed Care – PPO | Admitting: Neurology

## 2018-07-02 ENCOUNTER — Telehealth: Payer: Self-pay | Admitting: Neurology

## 2018-07-02 NOTE — Telephone Encounter (Signed)
We do not have medication here for this patient. I will not be able to get it before Thursday unfortunately.

## 2018-07-02 NOTE — Telephone Encounter (Signed)
Olivia Rogers.   Can you schedule her on a Thursday for botox? I have an 1115 open this Thursday if you want to ask.   Olivia Rogers can you hold the 1115 until we hear from Olivia Rogers?   Thanks!

## 2018-07-03 NOTE — Telephone Encounter (Signed)
Ok can you schedule her for a Thursday when you think you can get the medication? thanks

## 2018-07-03 NOTE — Telephone Encounter (Signed)
I will reach out and make her aware she already has an apt. She may have forgotten. Thank you.

## 2018-07-03 NOTE — Telephone Encounter (Signed)
She already has an apt on 02/20.Marland Kitchen was she wanting to reschedule that one? I scheduled that one with her at her last injection in December. Let me know if I need to move it!

## 2018-07-03 NOTE — Telephone Encounter (Signed)
I called the patient to make her aware of apt time but she did not answer. I left a VM asking her to call me back.   Phone room: if she calls back please let her know what her apt date and time is. Thanks

## 2018-07-03 NOTE — Telephone Encounter (Signed)
That;s fine it is just next week. So why was she asking me to get her an appointment on a Thursday? Leave it, will see her then thanks

## 2018-07-04 NOTE — Telephone Encounter (Signed)
Noted  

## 2018-07-04 NOTE — Telephone Encounter (Signed)
I called [facility] at [phone] to request a refill on the patients Botox medication. I spoke with [facility] who stated that the patient has a balance with them so they cannot proceed with filling the medication until it is paid. I called the patient to make her aware of this but she did not answer so I left a VM asking her to call me back.

## 2018-07-05 NOTE — Telephone Encounter (Signed)
I called the pharmacy to check status of the payment, and patient has not reached out to them.

## 2018-07-09 NOTE — Telephone Encounter (Signed)
I called to check status but she has still not reached out to the pharmacy.   I called to make the patient aware but she did not answer so I left a VM asking her to call me back asap.

## 2018-07-12 ENCOUNTER — Ambulatory Visit: Payer: BC Managed Care – PPO | Admitting: Neurology

## 2018-07-31 ENCOUNTER — Telehealth: Payer: Self-pay | Admitting: Neurology

## 2018-07-31 NOTE — Telephone Encounter (Signed)
Patient states that she needs a botox appt. Her botox has worn off

## 2018-07-31 NOTE — Telephone Encounter (Signed)
I called and scheduled the patient, she was offered earlier apt's but could not take them. She was also told she will need to speak with the pharmacy.

## 2018-08-20 ENCOUNTER — Telehealth: Payer: Self-pay | Admitting: Neurology

## 2018-08-20 NOTE — Telephone Encounter (Signed)
Pt returned call please call back when available.

## 2018-08-20 NOTE — Telephone Encounter (Signed)
I called and left the patient a VM making her aware that the office was closed due to COVID-19 and asked her to give me a call back.

## 2018-08-20 NOTE — Telephone Encounter (Signed)
I called the patient back and left a message stating that she did not need to return my call unless she has questions (she received the my chart message I sent her).

## 2018-08-22 ENCOUNTER — Ambulatory Visit: Payer: BC Managed Care – PPO | Admitting: Neurology

## 2018-08-23 NOTE — Telephone Encounter (Signed)
I called and completed PA. FG#[id number]

## 2018-09-17 ENCOUNTER — Telehealth: Payer: Self-pay | Admitting: Neurology

## 2018-09-17 NOTE — Telephone Encounter (Signed)
I called the patient but she did not answer so I left a VM asking her to call me back. I also sent a mychart apt confirming that the apt time given would work for her.

## 2018-09-17 NOTE — Telephone Encounter (Signed)
Pt called needing to schedule a BOTOX appt soon. Please advise.

## 2018-09-20 NOTE — Telephone Encounter (Signed)
I called to schedule delivery but they are pending payment from the patient. I called the patient to make her aware but she did not answer so I left a VM asking her to call me back.

## 2018-09-24 NOTE — Telephone Encounter (Signed)
I scheduled delivery with representative Erie Noe and I asked her to schedule for an AM morning delivery. She stated she had done so. DW

## 2018-09-25 ENCOUNTER — Other Ambulatory Visit: Payer: Self-pay

## 2018-09-25 ENCOUNTER — Ambulatory Visit: Payer: BC Managed Care – PPO | Admitting: Neurology

## 2018-09-25 VITALS — Temp 99.1°F

## 2018-09-25 DIAGNOSIS — G43711 Chronic migraine without aura, intractable, with status migrainosus: Secondary | ICD-10-CM

## 2018-09-25 DIAGNOSIS — M542 Cervicalgia: Secondary | ICD-10-CM

## 2018-09-25 MED ORDER — GABAPENTIN 300 MG PO CAPS: ORAL_CAPSULE | ORAL | 6 refills | Status: DC

## 2018-09-25 MED ORDER — METHYLPREDNISOLONE 4 MG PO TBPK: ORAL_TABLET | ORAL | 1 refills | Status: DC

## 2018-09-25 NOTE — Progress Notes (Signed)
Botox- 100 units x 2 vials Lot: [id number] Expiration: 01/2021 NDC: 0023-1145-01  Bacteriostatic 0.9% Sodium Chloride- 4mL total Lot: [id number] Expiration: 02/21/2019 NDC: 0409-1966-02  Dx: [id number] S/P   

## 2018-09-25 NOTE — Progress Notes (Signed)
This is her third botox. +eyes, +masseters, + temples, + levator scapulae.  exceptional improvement. >>70% decrease frequency. Was reduced to 3 headache days a month.     Consent Form Botulism Toxin Injection For Chronic Migraine    Reviewed orally with patient, additionally signature is on file:  Botulism toxin has been approved by the Federal drug administration for treatment of chronic migraine. Botulism toxin does not cure chronic migraine and it may not be effective in some patients.  The administration of botulism toxin is accomplished by injecting a small amount of toxin into the muscles of the neck and head. Dosage must be titrated for each individual. Any benefits resulting from botulism toxin tend to wear off after 3 months with a repeat injection required if benefit is to be maintained. Injections are usually done every 3-4 months with maximum effect peak achieved by about 2 or 3 weeks. Botulism toxin is expensive and you should be sure of what costs you will incur resulting from the injection.  The side effects of botulism toxin use for chronic migraine may include:   -Transient, and usually mild, facial weakness with facial injections  -Transient, and usually mild, head or neck weakness with head/neck injections  -Reduction or loss of forehead facial animation due to forehead muscle weakness  -Eyelid drooping  -Dry eye  -Pain at the site of injection or bruising at the site of injection  -Double vision  -Potential unknown long term risks  Contraindications: You should not have Botox if you are pregnant, nursing, allergic to albumin, have an infection, skin condition, or muscle weakness at the site of the injection, or have myasthenia gravis, Lambert-Eaton syndrome, or ALS.  It is also possible that as with any injection, there may be an allergic reaction or no effect from the medication. Reduced effectiveness after repeated injections is sometimes seen and rarely infection at  the injection site may occur. All care will be taken to prevent these side effects. If therapy is given over a long time, atrophy and wasting in the muscle injected may occur. Occasionally the patient's become refractory to treatment because they develop antibodies to the toxin. In this event, therapy needs to be modified.  I have read the above information and consent to the administration of botulism toxin.    BOTOX PROCEDURE NOTE FOR MIGRAINE HEADACHE    Contraindications and precautions discussed with patient(above). Aseptic procedure was observed and patient tolerated procedure. Procedure performed by Dr. Artemio Aly  The condition has existed for more than 6 months, and pt does not have a diagnosis of ALS, Myasthenia Gravis or Lambert-Eaton Syndrome.  Risks and benefits of injections discussed and pt agrees to proceed with the procedure.  Written consent obtained  These injections are medically necessary. Pt  receives good benefits from these injections. These injections do not cause sedations or hallucinations which the oral therapies may cause.  Indication/Diagnosis: chronic migraine BOTOX(J0585) injection was performed according to protocol by [employer]. 200 units of BOTOX was dissolved into 4 cc NS.   NDC: [address]-81   Description of procedure:  The patient was placed in a sitting position. The standard protocol was used for Botox as follows, with 5 units of Botox injected at each site:   -Procerus muscle, midline injection  -Corrugator muscle, bilateral injection  -Frontalis muscle, bilateral injection, with 2 sites each side, medial injection was performed in the upper one third of the frontalis muscle, in the region vertical from the medial inferior edge of the superior  orbital rim. The lateral injection was again in the upper one third of the forehead vertically above the lateral limbus of the cornea, 1.5 cm lateral to the medial injection site.  -Temporalis muscle  injection, 4 sites, bilaterally. The first injection was 3 cm above the tragus of the ear, second injection site was 1.5 cm to 3 cm up from the first injection site in line with the tragus of the ear. The third injection site was 1.5-3 cm forward between the first 2 injection sites. The fourth injection site was 1.5 cm posterior to the second injection site.  -Occipitalis muscle injection, 3 sites, bilaterally. The first injection was done one half way between the occipital protuberance and the tip of the mastoid process behind the ear. The second injection site was done lateral and superior to the first, 1 fingerbreadth from the first injection. The third injection site was 1 fingerbreadth superiorly and medially from the first injection site.  -Cervical paraspinal muscle injection, 2 sites, bilateral knee first injection site was 1 cm from the midline of the cervical spine, 3 cm inferior to the lower border of the occipital protuberance. The second injection site was 1.5 cm superiorly and laterally to the first injection site.  -Trapezius muscle injection was performed at 3 sites, bilaterally. The first injection site was in the upper trapezius muscle halfway between the inflection point of the neck, and the acromion. The second injection site was one half way between the acromion and the first injection site. The third injection was done between the first injection site and the inflection point of the neck.   Will return for repeat injection in 3 months.   A 200 unit sof Botox was used, 155 units were injected, the rest of the Botox was wasted. The patient tolerated the procedure well, there were no complications of the above procedure.

## 2018-10-08 ENCOUNTER — Other Ambulatory Visit: Payer: Self-pay | Admitting: Neurology

## 2018-10-08 NOTE — Addendum Note (Signed)
Addended by: Naomie Dean B on: 10/08/2018 04:55 PM   Modules accepted: Orders

## 2018-11-12 ENCOUNTER — Telehealth: Payer: Self-pay | Admitting: Neurology

## 2018-11-12 NOTE — Telephone Encounter (Signed)
I called to schedule the patient for her Botox injection but she did not answer so I left a VM asking her to call us back. DW  °

## 2018-11-19 NOTE — Telephone Encounter (Signed)
I called the patient for a second time and left a VM asking her to call me back. DW

## 2018-12-18 ENCOUNTER — Ambulatory Visit
Admission: RE | Admit: 2018-12-18 | Discharge: 2018-12-18 | Disposition: A | Discharge status: Home / Self Care | Payer: BC Managed Care – PPO | Source: Ambulatory Visit | Attending: Neurology | Admitting: Neurology

## 2018-12-18 ENCOUNTER — Other Ambulatory Visit: Payer: Self-pay

## 2018-12-18 DIAGNOSIS — M542 Cervicalgia: Secondary | ICD-10-CM

## 2018-12-19 ENCOUNTER — Telehealth: Payer: Self-pay | Admitting: Neurology

## 2018-12-19 NOTE — Telephone Encounter (Signed)
I reached out to the pt and advised on results, she verbalized understanding.

## 2018-12-19 NOTE — Telephone Encounter (Signed)
Olivia Rogers, patient had an xray yesterday and [facility] doesn't have a report yet. I think they read the xrays not [place]? If they do (and we do not) do you mind calling over there to see if they can read it. thanks

## 2018-12-19 NOTE — Telephone Encounter (Signed)
Results are normal, thanks

## 2018-12-19 NOTE — Telephone Encounter (Signed)
The results for the Xray are up now.

## 2018-12-27 ENCOUNTER — Telehealth: Payer: Self-pay | Admitting: Neurology

## 2018-12-27 NOTE — Telephone Encounter (Signed)
[facility] [phone] to schedule delivery for Tuesday Morning.

## 2018-12-27 NOTE — Telephone Encounter (Signed)
Called Patient and  Left her a voice mail [facility] before Botox is delivered   [facility] [phone]

## 2019-01-01 ENCOUNTER — Ambulatory Visit: Payer: BC Managed Care – PPO | Admitting: Neurology

## 2019-01-07 ENCOUNTER — Ambulatory Visit: Payer: BC Managed Care – PPO | Admitting: Neurology

## 2019-01-08 ENCOUNTER — Encounter: Payer: Self-pay | Admitting: Neurology

## 2019-01-30 ENCOUNTER — Telehealth: Payer: Self-pay | Admitting: *Deleted

## 2019-01-30 ENCOUNTER — Encounter: Payer: Self-pay | Admitting: *Deleted

## 2019-01-30 ENCOUNTER — Telehealth: Payer: Self-pay | Admitting: Neurology

## 2019-01-30 NOTE — Telephone Encounter (Signed)
Pt called back to r/s her BOTOX appt. She states that you can reach her through Effie or phone.

## 2019-01-30 NOTE — Telephone Encounter (Signed)
Sent pt a Therapist, music. Also tried to call her once more but received no answer. On mychart, offered Mon 9/21 @ 10 AM. Will await pt's response.

## 2019-01-30 NOTE — Telephone Encounter (Signed)
Called pt & LVM (ok per DPR) informing pt that Dr. Jaynee Eagles is out of office next Tues 9/15. Appt will need to be r/s. I asked for call back as soon as possible. Advised pt can be scheduled with NP if she is amenable. Office number left in message.

## 2019-02-05 ENCOUNTER — Ambulatory Visit: Payer: Self-pay | Admitting: Neurology

## 2019-02-13 ENCOUNTER — Telehealth: Payer: Self-pay | Admitting: Neurology

## 2019-02-13 ENCOUNTER — Ambulatory Visit: Payer: BC Managed Care – PPO | Admitting: Neurology

## 2019-02-13 ENCOUNTER — Other Ambulatory Visit: Payer: Self-pay

## 2019-02-13 VITALS — Temp 98.2°F

## 2019-02-13 DIAGNOSIS — G43711 Chronic migraine without aura, intractable, with status migrainosus: Secondary | ICD-10-CM | POA: Diagnosis not present

## 2019-02-13 MED ORDER — GABAPENTIN 300 MG PO CAPS: 600.0000 mg | ORAL_CAPSULE | Freq: Three times a day (TID) | ORAL | 4 refills | Status: DC

## 2019-02-13 NOTE — Telephone Encounter (Signed)
Spoke with [employer]. They are seeking clarification on the directions for the Gabapentin.

## 2019-02-13 NOTE — Progress Notes (Signed)
Botox- 100 units x 2 vials Lot: [id number] Expiration: 06/2021 NDC: 1165-7903-83  Bacteriostatic 0.9% Sodium Chloride- 27mL total Lot: [id number] Expiration: 02/21/2019 NDC: 1916-[address]60-04  Dx: H99.774 S/P

## 2019-02-13 NOTE — Progress Notes (Signed)
Consent Form Botulism Toxin Injection For Chronic Migraine  Significant improvement, no migraines since last being seen, her jaw does get stiff inbetween treatments otherwise great.   Reviewed orally with patient, additionally signature is on file:  Botulism toxin has been approved by the Federal drug administration for treatment of chronic migraine. Botulism toxin does not cure chronic migraine and it may not be effective in some patients.  The administration of botulism toxin is accomplished by injecting a small amount of toxin into the muscles of the neck and head. Dosage must be titrated for each individual. Any benefits resulting from botulism toxin tend to wear off after 3 months with a repeat injection required if benefit is to be maintained. Injections are usually done every 3-4 months with maximum effect peak achieved by about 2 or 3 weeks. Botulism toxin is expensive and you should be sure of what costs you will incur resulting from the injection.  The side effects of botulism toxin use for chronic migraine may include:   -Transient, and usually mild, facial weakness with facial injections  -Transient, and usually mild, head or neck weakness with head/neck injections  -Reduction or loss of forehead facial animation due to forehead muscle weakness  -Eyelid drooping  -Dry eye  -Pain at the site of injection or bruising at the site of injection  -Double vision  -Potential unknown long term risks  Contraindications: You should not have Botox if you are pregnant, nursing, allergic to albumin, have an infection, skin condition, or muscle weakness at the site of the injection, or have myasthenia gravis, Lambert-Eaton syndrome, or ALS.  It is also possible that as with any injection, there may be an allergic reaction or no effect from the medication. Reduced effectiveness after repeated injections is sometimes seen and rarely infection at the injection site may occur. All care will be taken  to prevent these side effects. If therapy is given over a long time, atrophy and wasting in the muscle injected may occur. Occasionally the patient's become refractory to treatment because they develop antibodies to the toxin. In this event, therapy needs to be modified.  I have read the above information and consent to the administration of botulism toxin.    BOTOX PROCEDURE NOTE FOR MIGRAINE HEADACHE    Contraindications and precautions discussed with patient(above). Aseptic procedure was observed and patient tolerated procedure. Procedure performed by Dr. Artemio Aly  The condition has existed for more than 6 months, and pt does not have a diagnosis of ALS, Myasthenia Gravis or Lambert-Eaton Syndrome.  Risks and benefits of injections discussed and pt agrees to proceed with the procedure.  Written consent obtained  These injections are medically necessary. Pt  receives good benefits from these injections. These injections do not cause sedations or hallucinations which the oral therapies may cause.  Description of procedure:  The patient was placed in a sitting position. The standard protocol was used for Botox as follows, with 5 units of Botox injected at each site:   -Procerus muscle, midline injection  -Corrugator muscle, bilateral injection  -Frontalis muscle, bilateral injection, with 2 sites each side, medial injection was performed in the upper one third of the frontalis muscle, in the region vertical from the medial inferior edge of the superior orbital rim. The lateral injection was again in the upper one third of the forehead vertically above the lateral limbus of the cornea, 1.5 cm lateral to the medial injection site.  -Temporalis muscle injection, 4 sites, bilaterally. The first injection  was 3 cm above the tragus of the ear, second injection site was 1.5 cm to 3 cm up from the first injection site in line with the tragus of the ear. The third injection site was 1.5-3 cm  forward between the first 2 injection sites. The fourth injection site was 1.5 cm posterior to the second injection site.   -Occipitalis muscle injection, 3 sites, bilaterally. The first injection was done one half way between the occipital protuberance and the tip of the mastoid process behind the ear. The second injection site was done lateral and superior to the first, 1 fingerbreadth from the first injection. The third injection site was 1 fingerbreadth superiorly and medially from the first injection site.  -Cervical paraspinal muscle injection, 2 sites, bilateral knee first injection site was 1 cm from the midline of the cervical spine, 3 cm inferior to the lower border of the occipital protuberance. The second injection site was 1.5 cm superiorly and laterally to the first injection site.  -Trapezius muscle injection was performed at 3 sites, bilaterally. The first injection site was in the upper trapezius muscle halfway between the inflection point of the neck, and the acromion. The second injection site was one half way between the acromion and the first injection site. The third injection was done between the first injection site and the inflection point of the neck.   Will return for repeat injection in 3 months.   200 units of Botox was used, any Botox not injected was wasted. The patient tolerated the procedure well, there were no complications of the above procedure.

## 2019-02-13 NOTE — Telephone Encounter (Signed)
Phone rep checked voicemail from [place] #[id number] - [employer], Mildred They are asking for a call back re: pt's  gabapentin (NEURONTIN) 300 MG capsule

## 2019-02-13 NOTE — Addendum Note (Signed)
Addended by: Sarina Ill B on: 02/13/2019 10:59 AM   Modules accepted: Orders

## 2019-02-13 NOTE — Telephone Encounter (Signed)
600mg  tid thanks

## 2019-02-14 MED ORDER — GABAPENTIN 300 MG PO CAPS: 600.0000 mg | ORAL_CAPSULE | Freq: Three times a day (TID) | ORAL | 4 refills | Status: DC

## 2019-02-14 NOTE — Telephone Encounter (Signed)
Order clarified in Epic- Gabapentin 600 mg TID. Sent to [facility]. V.O. Dr. Jaynee Eagles.

## 2019-02-14 NOTE — Addendum Note (Signed)
Addended by: Gildardo Griffes on: 02/14/2019 07:15 AM   Modules accepted: Orders

## 2019-02-19 ENCOUNTER — Telehealth: Payer: Self-pay

## 2019-02-19 NOTE — Telephone Encounter (Signed)
I called to schedule the patient but they did not answer so I left a VM asking them to call me back.  ° °

## 2019-05-28 ENCOUNTER — Ambulatory Visit: Payer: Self-pay | Admitting: Neurology

## 2019-07-04 ENCOUNTER — Ambulatory Visit: Payer: Self-pay | Admitting: Neurology

## 2019-07-04 ENCOUNTER — Telehealth: Payer: Self-pay | Admitting: Neurology

## 2019-07-04 NOTE — Telephone Encounter (Signed)
Pt has called to report she can not make today's appointment.  Pt has a sick child

## 2019-07-04 NOTE — Telephone Encounter (Signed)
Noted , sorry to hear that

## 2019-07-10 NOTE — Telephone Encounter (Signed)
I called to rs the patient but she did not answer so I left a VM asking her to call back. DWD

## 2019-08-14 ENCOUNTER — Ambulatory Visit: Payer: BC Managed Care – PPO | Admitting: Neurology

## 2019-08-20 ENCOUNTER — Ambulatory Visit: Payer: BC Managed Care – PPO | Admitting: Neurology

## 2019-08-20 ENCOUNTER — Other Ambulatory Visit: Payer: Self-pay

## 2019-08-20 VITALS — Temp 97.4°F

## 2019-08-20 DIAGNOSIS — G43711 Chronic migraine without aura, intractable, with status migrainosus: Secondary | ICD-10-CM

## 2019-08-20 MED ORDER — UBRELVY 100 MG PO TABS: 100.0000 mg | ORAL_TABLET | ORAL | 0 refills | Status: DC | PRN

## 2019-08-20 NOTE — Patient Instructions (Signed)
Ubrogepant tablets What is this medicine? UBROGEPANT (ue BROE je pant) is used to treat migraine headaches with or without aura. An aura is a strange feeling or visual disturbance that warns you of an attack. It is not used to prevent migraines. This medicine may be used for other purposes; ask your health care provider or pharmacist if you have questions. COMMON BRAND NAME(S): Ubrelvy What should I tell my health care provider before I take this medicine? They need to know if you have any of these conditions:  kidney disease  liver disease  an unusual or allergic reaction to ubrogepant, other medicines, foods, dyes, or preservatives  pregnant or trying to get pregnant  breast-feeding How should I use this medicine? Take this medicine by mouth with a glass of water. Follow the directions on the prescription label. You can take it with or without food. If it upsets your stomach, take it with food. Take your medicine at regular intervals. Do not take it more often than directed. Do not stop taking except on your doctor's advice. Talk to your pediatrician about the use of this medicine in children. Special care may be needed. Overdosage: If you think you have taken too much of this medicine contact a poison control center or emergency room at once. NOTE: This medicine is only for you. Do not share this medicine with others. What if I miss a dose? This does not apply. This medicine is not for regular use. What may interact with this medicine? Do not take this medicine with any of the following medicines:  ceritinib  certain antibiotics like chloramphenicol, clarithromycin, telithromycin  certain antivirals for HIV like atazanavir, cobicistat, darunavir, delavirdine, fosamprenavir, indinavir, ritonavir  certain medicines for fungal infections like itraconazole, ketoconazole, posaconazole,  voriconazole  conivaptan  grapefruit  idelalisib  mifepristone  nefazodone  ribociclib This medicine may also interact with the following medications:  carvedilol  certain medicines for seizures like phenobarbital, phenytoin  ciprofloxacin  cyclosporine  eltrombopag  fluconazole  fluvoxamine  quinidine  rifampin  St. John's wort  verapamil This list may not describe all possible interactions. Give your health care provider a list of all the medicines, herbs, non-prescription drugs, or dietary supplements you use. Also tell them if you smoke, drink alcohol, or use illegal drugs. Some items may interact with your medicine. What should I watch for while using this medicine? Visit your health care professional for regular checks on your progress. Tell your health care professional if your symptoms do not start to get better or if they get worse. Your mouth may get dry. Chewing sugarless gum or sucking hard candy and drinking plenty of water may help. Contact your health care professional if the problem does not go away or is severe. What side effects may I notice from receiving this medicine? Side effects that you should report to your doctor or health care professional as soon as possible:  allergic reactions like skin rash, itching or hives; swelling of the face, lips, or tongue Side effects that usually do not require medical attention (report these to your doctor or health care professional if they continue or are bothersome):  drowsiness  dry mouth  nausea  tiredness This list may not describe all possible side effects. Call your doctor for medical advice about side effects. You may report side effects to FDA at 1-800-FDA-1088. Where should I keep my medicine? Keep out of the reach of children. Store at room temperature between 15 and 30 degrees C (59   and 86 degrees F). Throw away any unused medicine after the expiration date. NOTE: This sheet is a summary. It  may not cover all possible information. If you have questions about this medicine, talk to your doctor, pharmacist, or health care provider.  2020 Elsevier/Gold Standard (2018-07-26 08:50:55)  

## 2019-08-20 NOTE — Progress Notes (Signed)
Consent Form Botulism Toxin Injection For Chronic Migraine  08/20/2019: Significant improvement, no migraines since last being seen, her jaw does get stiff inbetween treatments otherwise great. We put 10 units in each masseter, can inject the lateral pterygoids as well, consider next time.   Reviewed orally with patient, additionally signature is on file:  Botulism toxin has been approved by the Federal drug administration for treatment of chronic migraine. Botulism toxin does not cure chronic migraine and it may not be effective in some patients.  The administration of botulism toxin is accomplished by injecting a small amount of toxin into the muscles of the neck and head. Dosage must be titrated for each individual. Any benefits resulting from botulism toxin tend to wear off after 3 months with a repeat injection required if benefit is to be maintained. Injections are usually done every 3-4 months with maximum effect peak achieved by about 2 or 3 weeks. Botulism toxin is expensive and you should be sure of what costs you will incur resulting from the injection.  The side effects of botulism toxin use for chronic migraine may include:   -Transient, and usually mild, facial weakness with facial injections  -Transient, and usually mild, head or neck weakness with head/neck injections  -Reduction or loss of forehead facial animation due to forehead muscle weakness  -Eyelid drooping  -Dry eye  -Pain at the site of injection or bruising at the site of injection  -Double vision  -Potential unknown long term risks  Contraindications: You should not have Botox if you are pregnant, nursing, allergic to albumin, have an infection, skin condition, or muscle weakness at the site of the injection, or have myasthenia gravis, Lambert-Eaton syndrome, or ALS.  It is also possible that as with any injection, there may be an allergic reaction or no effect from the medication. Reduced effectiveness after  repeated injections is sometimes seen and rarely infection at the injection site may occur. All care will be taken to prevent these side effects. If therapy is given over a long time, atrophy and wasting in the muscle injected may occur. Occasionally the patient's become refractory to treatment because they develop antibodies to the toxin. In this event, therapy needs to be modified.  I have read the above information and consent to the administration of botulism toxin.    BOTOX PROCEDURE NOTE FOR MIGRAINE HEADACHE    Contraindications and precautions discussed with patient(above). Aseptic procedure was observed and patient tolerated procedure. Procedure performed by Dr. Artemio Aly  The condition has existed for more than 6 months, and pt does not have a diagnosis of ALS, Myasthenia Gravis or Lambert-Eaton Syndrome.  Risks and benefits of injections discussed and pt agrees to proceed with the procedure.  Written consent obtained  These injections are medically necessary. Pt  receives good benefits from these injections. These injections do not cause sedations or hallucinations which the oral therapies may cause.  Description of procedure:  The patient was placed in a sitting position. The standard protocol was used for Botox as follows, with 5 units of Botox injected at each site:   -Procerus muscle, midline injection  -Corrugator muscle, bilateral injection  -Frontalis muscle, bilateral injection, with 2 sites each side, medial injection was performed in the upper one third of the frontalis muscle, in the region vertical from the medial inferior edge of the superior orbital rim. The lateral injection was again in the upper one third of the forehead vertically above the lateral limbus of the cornea,  1.5 cm lateral to the medial injection site.  -Temporalis muscle injection, 4 sites, bilaterally. The first injection was 3 cm above the tragus of the ear, second injection site was 1.5 cm to 3  cm up from the first injection site in line with the tragus of the ear. The third injection site was 1.5-3 cm forward between the first 2 injection sites. The fourth injection site was 1.5 cm posterior to the second injection site.   -Occipitalis muscle injection, 3 sites, bilaterally. The first injection was done one half way between the occipital protuberance and the tip of the mastoid process behind the ear. The second injection site was done lateral and superior to the first, 1 fingerbreadth from the first injection. The third injection site was 1 fingerbreadth superiorly and medially from the first injection site.  -Cervical paraspinal muscle injection, 2 sites, bilateral knee first injection site was 1 cm from the midline of the cervical spine, 3 cm inferior to the lower border of the occipital protuberance. The second injection site was 1.5 cm superiorly and laterally to the first injection site.  -Trapezius muscle injection was performed at 3 sites, bilaterally. The first injection site was in the upper trapezius muscle halfway between the inflection point of the neck, and the acromion. The second injection site was one half way between the acromion and the first injection site. The third injection was done between the first injection site and the inflection point of the neck.   Will return for repeat injection in 3 months.   200 units of Botox was used, any Botox not injected was wasted. The patient tolerated the procedure well, there were no complications of the above procedure.

## 2019-08-20 NOTE — Progress Notes (Signed)
Botox- 100 units x 2 vials Lot: [id number] Expiration: 01/2022 NDC: 7681-1572-62  Bacteriostatic 0.9% Sodium Chloride- 90mL total Lot: [id number] Expiration: 08/22/2019 NDC: 4163-8453-64  Dx: W80.321 S/P

## 2019-11-05 ENCOUNTER — Other Ambulatory Visit: Payer: Self-pay | Admitting: Neurology

## 2019-11-05 MED ORDER — UBRELVY 100 MG PO TABS: 100.0000 mg | ORAL_TABLET | ORAL | 11 refills | Status: DC | PRN

## 2019-11-06 NOTE — Telephone Encounter (Signed)
Completed Bernita Raisin PA on Cover My Meds. (Key: [id number]) . Awaiting determination from [facility].

## 2019-11-12 ENCOUNTER — Telehealth: Payer: Self-pay | Admitting: Neurology

## 2019-11-12 NOTE — Telephone Encounter (Signed)
Patient has Botox appointment on 7/6. I do not see a PA on file for her. I filled out new PA form to be on the safe side and I gave to work-in MD to sign. (Dr. Lucia Gaskins out of office)

## 2019-11-26 ENCOUNTER — Ambulatory Visit: Payer: BC Managed Care – PPO | Admitting: Neurology

## 2019-11-26 NOTE — Telephone Encounter (Signed)
I called [employer] [phone]) and spoke with Burna Mortimer in the pharmacy to check on PA status. Patient PA request approved, CoverMyMeds key #[id number], approval [id number]. Valid 11/12/19 to 11/10/20. 200U Botox, quantity of 1 every 90 days.

## 2019-12-17 ENCOUNTER — Other Ambulatory Visit: Payer: Self-pay | Admitting: *Deleted

## 2019-12-17 DIAGNOSIS — G43711 Chronic migraine without aura, intractable, with status migrainosus: Secondary | ICD-10-CM

## 2019-12-17 MED ORDER — BOTOX 200 UNITS IJ SOLR: INTRAMUSCULAR | 1 refills | Status: AC

## 2019-12-18 ENCOUNTER — Telehealth: Payer: Self-pay | Admitting: Neurology

## 2019-12-18 NOTE — Telephone Encounter (Signed)
[facility] - [place], [place] - [address] (Sharmin) request preauthorization status (reference key: [id number] for Botulinum Toxin Type A (BOTOX) 200 units SOLR. Would like nurse to call back [phone].

## 2019-12-25 NOTE — Telephone Encounter (Signed)
Called and spoke to [facility] gave them PA information I called [employer] [phone]) and spoke with Burna Mortimer in the pharmacy to check on PA status. Patient PA request approved, CoverMyMeds key #[id number], approval [id number]. Valid 11/12/19 to 11/10/20. 200U Botox, quantity of 1 every 90 days   Relayed to them patient had a upcoming apt 01/07/2020   [facility] is verifying approval and benefits and will call us back with a date for shipment . Patient needs to also call and give consent . [phone] .   I have called patient and left her a message asking her to call [facility] . I will send her a My- chart message as well .

## 2019-12-31 NOTE — Telephone Encounter (Signed)
I called [facility] [phone]) and spoke with Lyla Son to check the status of the prescription. Patient has an injection on 8/17. Lyla Son states that the pharmacist is currently verifying the prescription. They will reach out to [place] when delivery is ready to schedule.

## 2019-12-31 NOTE — Telephone Encounter (Signed)
I received a call from Sharmin with [facility]. She was calling to check status of PA. I advised her of the same information that I gave the previous representative.

## 2020-01-02 NOTE — Telephone Encounter (Signed)
I called [facility] to check the status of the order. The representative states that it is still in process.

## 2020-01-06 NOTE — Telephone Encounter (Signed)
I called [facility] to check the status after speaking to them this morning. I spoke with Zella Ball, who states the patient has not yet given consent for shipment. Zella Ball called the patient and LVM, and then I called the patient and also LVM hoping she will call to give consent today. I advised patient that if I do not hear from her today, she may have to reschedule her appointment tomorrow.

## 2020-01-06 NOTE — Telephone Encounter (Signed)
I called [facility] this morning as patient's appointment is tomorrow and we need the medication delivered. I spoke with Jeb Levering, who states that the insurance kept processing under the pharmacy benefit and they are needing it to process under the medical benefit. I provided Boris with the same information I gave the other two reps I have spoken with and he states he has updated it and will send it to the clearance team. I asked him to please expedite the request because patient does have an appointment tomorrow.

## 2020-01-07 ENCOUNTER — Ambulatory Visit: Payer: BC Managed Care – PPO | Admitting: Neurology

## 2020-01-07 NOTE — Telephone Encounter (Signed)
See MyChart message from yesterday, patient had to reschedule her Botox appointment for personal reasons.  I called [facility] and spoke with Verlon Au to set up medication delivery. Medication will be delivered on 8/24.

## 2020-01-14 NOTE — Telephone Encounter (Signed)
(  1) 200U vial of Botox delivered today from [facility]. 

## 2020-01-20 NOTE — Telephone Encounter (Signed)
Patient has not had a Botox injection since March. Am I able to use a work-in or 4pm spot so patient doesn't have to wait until November?

## 2020-01-23 ENCOUNTER — Ambulatory Visit: Payer: BC Managed Care – PPO | Admitting: Family Medicine

## 2020-01-23 ENCOUNTER — Other Ambulatory Visit: Payer: Self-pay

## 2020-01-23 DIAGNOSIS — G43109 Migraine with aura, not intractable, without status migrainosus: Secondary | ICD-10-CM | POA: Diagnosis not present

## 2020-01-23 MED ORDER — GABAPENTIN 300 MG PO CAPS: 600.0000 mg | ORAL_CAPSULE | Freq: Three times a day (TID) | ORAL | 4 refills | Status: AC

## 2020-01-23 NOTE — Progress Notes (Signed)
Botox-200unitsx1 vials Lot: [id number] Expiration: 08/2022 NDC: 3267-1245-80   0.9% Sodium Chloride- 78mL total Lot: [id number] Expiration: 02/2022 NDC: 50539-767-34  Dx: Migaines  SP  Consent signed

## 2020-01-23 NOTE — Progress Notes (Signed)
She reports that she is doing well. Botox helps significantly with migraines. She may have 2-3 migraines per month, easily aborted with ibuprofen and rest. She continues gabapentin 600mg  TID for prevention.  Ubrelvy did not help.   Consent Form Botulism Toxin Injectio-n For Chronic Migraine    Reviewed orally with patient, additionally signature is on file:  Botulism toxin has been approved by the Federal drug administration for treatment of chronic migraine. Botulism toxin does not cure chronic migraine and it may not be effective in some patients.  The administration of botulism toxin is accomplished by injecting a small amount of toxin into the muscles of the neck and head. Dosage must be titrated for each individual. Any benefits resulting from botulism toxin tend to wear off after 3 months with a repeat injection required if benefit is to be maintained. Injections are usually done every 3-4 months with maximum effect peak achieved by about 2 or 3 weeks. Botulism toxin is expensive and you should be sure of what costs you will incur resulting from the injection..   The side effects of botulism toxin use for chronic migraine may include:   -Transient, and usually mild, facial weakness with facial injections  -Transient, and usually mild, head or neck weakness with head/neck injections  -Reduction or loss of forehead facial animation due to forehead muscle weakness  -Eyelid drooping  -Dry eye  -Pain at the site of injection or bruising at the site of injection  -Double vision  -Potential unknown long term risks   Contraindications: You should not have Botox if you are pregnant, nursing, allergic to albumin, have an infection, skin condition, or muscle weakness at the site of the injection, or have myasthenia gravis, Lambert-Eaton syndrome, or ALS.  It is also possible that as with any injection, there may be an allergic reaction or no effect from the medication. Reduced effectiveness  after repeated injections is sometimes seen and rarely infection at the injection site may occur. All care will be taken to prevent these side effects. If therapy is given over a long time, atrophy and wasting in the muscle injected may occur. Occasionally the patient's become refractory to treatment because they develop antibodies to the toxin. In this event, therapy needs to be modified.  I have read the above information and consent to the administration of botulism toxin.    BOTOX PROCEDURE NOTE FOR MIGRAINE HEADACHE  Contraindications and precautions discussed with patient(above). Aseptic procedure was observed and patient tolerated procedure. Procedure performed by , FNP-C.   The condition has existed for more than 6 months, and pt does not have a diagnosis of ALS, Myasthenia Gravis or Lambert-Eaton Syndrome.  Risks and benefits of injections discussed and pt agrees to proceed with the procedure.  Written consent obtained  These injections are medically necessary. Pt  receives good benefits from these injections. These injections do not cause sedations or hallucinations which the oral therapies may cause.   Description of procedure:  The patient was placed in a sitting position. The standard protocol was used for Botox as follows, with 5 units of Botox injected at each site:  -Procerus muscle, midline injection  -Corrugator muscle, bilateral injection  -Frontalis muscle, bilateral injection, with 2 sites each side, medial injection was performed in the upper one third of the frontalis muscle, in the region vertical from the medial inferior edge of the superior orbital rim. The lateral injection was again in the upper one third of the forehead vertically above  the lateral limbus of the cornea, 1.5 cm lateral to the medial injection site.  -Temporalis muscle injection, 4 sites, bilaterally. The first injection was 3 cm above the tragus of the ear, second injection site was 1.5  cm to 3 cm up from the first injection site in line with the tragus of the ear. The third injection site was 1.5-3 cm forward between the first 2 injection sites. The fourth injection site was 1.5 cm posterior to the second injection site. 5th site laterally in the temporalis  muscleat the level of the outer canthus.  -Occipitalis muscle injection, 3 sites, bilaterally. The first injection was done one half way between the occipital protuberance and the tip of the mastoid process behind the ear. The second injection site was done lateral and superior to the first, 1 fingerbreadth from the first injection. The third injection site was 1 fingerbreadth superiorly and medially from the first injection site.  -Cervical paraspinal muscle injection, 2 sites, bilaterally. The first injection site was 1 cm from the midline of the cervical spine, 3 cm inferior to the lower border of the occipital protuberance. The second injection site was 1.5 cm superiorly and laterally to the first injection site.  -Trapezius muscle injection was performed at 3 sites, bilaterally. The first injection site was in the upper trapezius muscle halfway between the inflection point of the neck, and the acromion. The second injection site was one half way between the acromion and the first injection site. The third injection was done between the first injection site and the inflection point of the neck.   Will return for repeat injection in 3 months.   A total of 200 units of Botox was prepared, 155 units of Botox was injected as documented above, any Botox not injected was wasted. The patient tolerated the procedure well, there were no complications of the above procedure.

## 2020-04-27 ENCOUNTER — Telehealth: Payer: Self-pay | Admitting: Family Medicine

## 2020-04-27 NOTE — Telephone Encounter (Signed)
Patient has a Botox appointment on 12/9. I called [facility] and spoke with [facility] to schedule Botox delivery. Botox TBD 12/9.

## 2020-04-29 NOTE — Progress Notes (Deleted)
She reports that she is doing well. Botox helps significantly with migraines. She may have 2-3 migraines per month, easily aborted with ibuprofen and rest. She continues gabapentin 600mg  TID for prevention.  Ubrelvy did not help.   Consent Form Botulism Toxin Injectio-n For Chronic Migraine    Reviewed orally with patient, additionally signature is on file:  Botulism toxin has been approved by the Federal drug administration for treatment of chronic migraine. Botulism toxin does not cure chronic migraine and it may not be effective in some patients.  The administration of botulism toxin is accomplished by injecting a small amount of toxin into the muscles of the neck and head. Dosage must be titrated for each individual. Any benefits resulting from botulism toxin tend to wear off after 3 months with a repeat injection required if benefit is to be maintained. Injections are usually done every 3-4 months with maximum effect peak achieved by about 2 or 3 weeks. Botulism toxin is expensive and you should be sure of what costs you will incur resulting from the injection..   The side effects of botulism toxin use for chronic migraine may include:   -Transient, and usually mild, facial weakness with facial injections  -Transient, and usually mild, head or neck weakness with head/neck injections  -Reduction or loss of forehead facial animation due to forehead muscle weakness  -Eyelid drooping  -Dry eye  -Pain at the site of injection or bruising at the site of injection  -Double vision  -Potential unknown long term risks   Contraindications: You should not have Botox if you are pregnant, nursing, allergic to albumin, have an infection, skin condition, or muscle weakness at the site of the injection, or have myasthenia gravis, Lambert-Eaton syndrome, or ALS.  It is also possible that as with any injection, there may be an allergic reaction or no effect from the medication. Reduced effectiveness  after repeated injections is sometimes seen and rarely infection at the injection site may occur. All care will be taken to prevent these side effects. If therapy is given over a long time, atrophy and wasting in the muscle injected may occur. Occasionally the patient's become refractory to treatment because they develop antibodies to the toxin. In this event, therapy needs to be modified.  I have read the above information and consent to the administration of botulism toxin.    BOTOX PROCEDURE NOTE FOR MIGRAINE HEADACHE  Contraindications and precautions discussed with patient(above). Aseptic procedure was observed and patient tolerated procedure. Procedure performed by , FNP-C.   The condition has existed for more than 6 months, and pt does not have a diagnosis of ALS, Myasthenia Gravis or Lambert-Eaton Syndrome.  Risks and benefits of injections discussed and pt agrees to proceed with the procedure.  Written consent obtained  These injections are medically necessary. Pt  receives good benefits from these injections. These injections do not cause sedations or hallucinations which the oral therapies may cause.   Description of procedure:  The patient was placed in a sitting position. The standard protocol was used for Botox as follows, with 5 units of Botox injected at each site:  -Procerus muscle, midline injection  -Corrugator muscle, bilateral injection  -Frontalis muscle, bilateral injection, with 2 sites each side, medial injection was performed in the upper one third of the frontalis muscle, in the region vertical from the medial inferior edge of the superior orbital rim. The lateral injection was again in the upper one third of the forehead vertically above  the lateral limbus of the cornea, 1.5 cm lateral to the medial injection site.  -Temporalis muscle injection, 4 sites, bilaterally. The first injection was 3 cm above the tragus of the ear, second injection site was 1.5  cm to 3 cm up from the first injection site in line with the tragus of the ear. The third injection site was 1.5-3 cm forward between the first 2 injection sites. The fourth injection site was 1.5 cm posterior to the second injection site. 5th site laterally in the temporalis  muscleat the level of the outer canthus.  -Occipitalis muscle injection, 3 sites, bilaterally. The first injection was done one half way between the occipital protuberance and the tip of the mastoid process behind the ear. The second injection site was done lateral and superior to the first, 1 fingerbreadth from the first injection. The third injection site was 1 fingerbreadth superiorly and medially from the first injection site.  -Cervical paraspinal muscle injection, 2 sites, bilaterally. The first injection site was 1 cm from the midline of the cervical spine, 3 cm inferior to the lower border of the occipital protuberance. The second injection site was 1.5 cm superiorly and laterally to the first injection site.  -Trapezius muscle injection was performed at 3 sites, bilaterally. The first injection site was in the upper trapezius muscle halfway between the inflection point of the neck, and the acromion. The second injection site was one half way between the acromion and the first injection site. The third injection was done between the first injection site and the inflection point of the neck.   Will return for repeat injection in 3 months.   A total of 200 units of Botox was prepared, 155 units of Botox was injected as documented above, any Botox not injected was wasted. The patient tolerated the procedure well, there were no complications of the above procedure.

## 2020-04-30 ENCOUNTER — Ambulatory Visit: Payer: Self-pay | Admitting: Family Medicine

## 2020-04-30 NOTE — Telephone Encounter (Signed)
(  1) 200U vial of Botox delivered today from [facility].   *Patient no-showed today's appointment.

## 2020-07-07 ENCOUNTER — Other Ambulatory Visit: Payer: Self-pay | Admitting: Neurology

## 2020-07-07 DIAGNOSIS — G43711 Chronic migraine without aura, intractable, with status migrainosus: Secondary | ICD-10-CM

## 2020-08-05 ENCOUNTER — Ambulatory Visit: Payer: BC Managed Care – PPO | Admitting: Family Medicine

## 2021-06-09 NOTE — Telephone Encounter (Signed)
Patient has not has Botox injection since 01/2020. She has a vial here from [facility]. Per office policy, this vial will now be placed in provider sample stock since patient has not used it and does not have appt scheduled.
